# Patient Record
Sex: Female | Born: 1970 | Race: White | Hispanic: No | Marital: Married | Smoking: Never smoker
Health system: Southern US, Community
[De-identification: ages and names within clinical notes are randomized; demographics above are authoritative.]

## PROBLEM LIST (undated history)

## (undated) DIAGNOSIS — C4491 Basal cell carcinoma of skin, unspecified: Secondary | ICD-10-CM

## (undated) DIAGNOSIS — R12 Heartburn: Secondary | ICD-10-CM

## (undated) DIAGNOSIS — L409 Psoriasis, unspecified: Secondary | ICD-10-CM

## (undated) DIAGNOSIS — F419 Anxiety disorder, unspecified: Secondary | ICD-10-CM

## (undated) DIAGNOSIS — M1990 Unspecified osteoarthritis, unspecified site: Secondary | ICD-10-CM

## (undated) HISTORY — DX: Heartburn: R12

## (undated) HISTORY — DX: Psoriasis, unspecified: L40.9

## (undated) HISTORY — DX: Unspecified osteoarthritis, unspecified site: M19.90

## (undated) HISTORY — PX: APPENDECTOMY: SHX54

## (undated) HISTORY — PX: CHOLECYSTECTOMY: SHX55

## (undated) HISTORY — PX: ANTERIOR CRUCIATE LIGAMENT REPAIR: SHX115

## (undated) HISTORY — DX: Anxiety disorder, unspecified: F41.9

---

## 2008-09-25 ENCOUNTER — Ambulatory Visit: Payer: Self-pay | Admitting: Oncology

## 2008-10-06 ENCOUNTER — Ambulatory Visit: Payer: Self-pay | Admitting: Oncology

## 2008-10-26 ENCOUNTER — Ambulatory Visit: Payer: Self-pay | Admitting: Oncology

## 2008-11-25 ENCOUNTER — Ambulatory Visit: Payer: Self-pay | Admitting: Oncology

## 2009-02-23 ENCOUNTER — Ambulatory Visit: Payer: Self-pay | Admitting: Oncology

## 2009-03-02 ENCOUNTER — Ambulatory Visit: Payer: Self-pay | Admitting: Oncology

## 2009-03-26 ENCOUNTER — Ambulatory Visit: Payer: Self-pay | Admitting: Oncology

## 2009-10-26 ENCOUNTER — Ambulatory Visit: Payer: Self-pay | Admitting: Oncology

## 2009-11-18 ENCOUNTER — Ambulatory Visit: Payer: Self-pay | Admitting: Oncology

## 2009-11-25 ENCOUNTER — Ambulatory Visit: Payer: Self-pay | Admitting: Oncology

## 2009-12-26 ENCOUNTER — Ambulatory Visit: Payer: Self-pay | Admitting: Oncology

## 2010-03-16 ENCOUNTER — Ambulatory Visit: Payer: Self-pay

## 2010-05-26 ENCOUNTER — Ambulatory Visit: Payer: Self-pay | Admitting: Oncology

## 2010-06-09 ENCOUNTER — Ambulatory Visit: Payer: Self-pay | Admitting: Unknown Physician Specialty

## 2010-06-24 ENCOUNTER — Ambulatory Visit: Payer: Self-pay | Admitting: Oncology

## 2010-06-25 ENCOUNTER — Ambulatory Visit: Payer: Self-pay | Admitting: Oncology

## 2010-12-30 ENCOUNTER — Ambulatory Visit: Payer: Self-pay | Admitting: Oncology

## 2011-01-26 ENCOUNTER — Ambulatory Visit: Payer: Self-pay | Admitting: Oncology

## 2011-09-07 ENCOUNTER — Ambulatory Visit: Payer: Self-pay | Admitting: Oncology

## 2011-09-26 ENCOUNTER — Ambulatory Visit: Payer: Self-pay | Admitting: Oncology

## 2011-10-21 ENCOUNTER — Inpatient Hospital Stay: Payer: Self-pay | Admitting: Surgery

## 2011-12-27 HISTORY — PX: APPENDECTOMY: SHX54

## 2016-12-06 ENCOUNTER — Ambulatory Visit (INDEPENDENT_AMBULATORY_CARE_PROVIDER_SITE_OTHER): Payer: BC Managed Care – PPO | Admitting: Urology

## 2016-12-06 ENCOUNTER — Encounter: Payer: Self-pay | Admitting: Urology

## 2016-12-06 VITALS — BP 139/82 | HR 75 | Ht 66.0 in | Wt 174.3 lb

## 2016-12-06 DIAGNOSIS — N390 Urinary tract infection, site not specified: Secondary | ICD-10-CM

## 2016-12-06 DIAGNOSIS — R350 Frequency of micturition: Secondary | ICD-10-CM | POA: Diagnosis not present

## 2016-12-06 DIAGNOSIS — R102 Pelvic and perineal pain: Secondary | ICD-10-CM

## 2016-12-06 DIAGNOSIS — R300 Dysuria: Secondary | ICD-10-CM | POA: Diagnosis not present

## 2016-12-06 LAB — BLADDER SCAN AMB NON-IMAGING: Scan Result: 68

## 2016-12-06 NOTE — Progress Notes (Signed)
12/06/2016 4:11 PM   Brenda Miles 1971/02/18 [id number]  Referring provider: Mortimer Fries, PA-C [address] [facility] [address] [place], [place] [address]  Chief Complaint  Patient presents with  . New Patient (Initial Visit)    recurrent uti referred by Dr. Mable Paris    HPI: Patient is a 45 -year-old Caucasian female who is referred to [place] by, Mortimer Fries, PA-C, for recurrent urinary tract infections.  Patient states that she has had 6 urinary tract infections since May 2017.    Her symptoms with a urinary tract infection consist of frequency, dysuria and suprapubic pain.  These symptoms are not consistent and occur intermittently.  She states that her symptoms did not completely clear with the antibiotics.    She saw her gynecologist at the [facility] center in [place].  That physician stated that she did not have recurrent UTI's after reviewing her records.  She states that her physician expressed a possibility of IC.    She denies gross hematuria, back pain, abdominal pain or flank pain.  She has not had any recent fevers, chills, nausea or vomiting.   She does not have a history of nephrolithiasis, GU surgery or GU trauma.   Reviewing her records,  she has had one documented positive urine culture for E. Coli.     She is sexually active.  She has not noted a correlation with her urinary tract infections and sexual intercourse.    She admits to diarrhea.   She does have incontinence with stress occasional.   She is not using incontinence pads.   She is having pain with bladder filling if she does not get up during the night with a full bladder.    She had not had any recent imaging studies.    She is drinking 64 oz of water daily.  She has coffee (1 cup) in the am.  No sodas.       PMH: Past Medical History:  Diagnosis Date  . Anxiety   . Arthritis   . Heartburn     Surgical History: Past Surgical History:  Procedure Laterality Date  .  ANTERIOR CRUCIATE LIGAMENT REPAIR    . APPENDECTOMY      Home Medications:    Medication List       Accurate as of 12/06/16  4:11 PM. Always use your most recent med list.          Cranberry 400 MG Caps Take by mouth.   DULoxetine 30 MG capsule Commonly known as:  CYMBALTA Take by mouth.   ferrous sulfate 325 (65 FE) MG tablet Take by mouth.   FISH OIL PO Take by mouth.   PROBIOTIC ADVANCED PO Take by mouth.       Allergies: No Known Allergies  Family History: Family History  Problem Relation Age of Onset  . Prostate cancer Neg Hx   . Kidney cancer Neg Hx   . Bladder Cancer Neg Hx     Social History:  reports that she has never smoked. She has never used smokeless tobacco. She reports that she drinks alcohol. She reports that she does not use drugs.  ROS: UROLOGY Frequent Urination?: No Hard to postpone urination?: Yes Burning/pain with urination?: No Get up at night to urinate?: Yes Leakage of urine?: Yes Urine stream starts and stops?: No Trouble starting stream?: No Do you have to strain to urinate?: No Blood in urine?: No Urinary tract infection?: Yes Sexually transmitted disease?: No Injury to kidneys or  bladder?: No Painful intercourse?: No Weak stream?: No Currently pregnant?: No Vaginal bleeding?: No Last menstrual period?: 11/20/2016  Gastrointestinal Nausea?: No Vomiting?: No Indigestion/heartburn?: Yes Diarrhea?: No Constipation?: No  Constitutional Fever: No Night sweats?: No Weight loss?: No Fatigue?: No  Skin Skin rash/lesions?: No Itching?: No  Eyes Blurred vision?: No Double vision?: No  Ears/Nose/Throat Sore throat?: No Sinus problems?: No  Hematologic/Lymphatic Swollen glands?: No Easy bruising?: No  Cardiovascular Leg swelling?: No Chest pain?: No  Respiratory Cough?: No Shortness of breath?: No  Endocrine Excessive thirst?: No  Musculoskeletal Back pain?: No Joint pain?:  No  Neurological Headaches?: Yes Dizziness?: No  Psychologic Depression?: No Anxiety?: No  Physical Exam: BP 139/82   Pulse 75   Ht 5\' 6"  (1.676 m)   Wt 174 lb 4.8 oz (79.1 kg)   LMP 11/20/2016   BMI 28.13 kg/m   Constitutional: Well nourished. Alert and oriented, No acute distress. HEENT: [place] AT, moist mucus membranes. Trachea midline, no masses. Cardiovascular: No clubbing, cyanosis, or edema. Respiratory: Normal respiratory effort, no increased work of breathing. GI: Abdomen is soft, non tender, non distended, no abdominal masses. Liver and spleen not palpable.  No hernias appreciated.  Stool sample for occult testing is not indicated.   GU: No CVA tenderness.  No bladder fullness or masses.   Skin: No rashes, bruises or suspicious lesions. Lymph: No cervical or inguinal adenopathy. Neurologic: Grossly intact, no focal deficits, moving all 4 extremities. Psychiatric: Normal mood and affect.  Laboratory Data: Urinalysis Unremarkable.  See EPIC.   Pertinent Imaging: Results for JONIYA, TAPER (MRN [id number]) as of 12/06/2016 15:47  Ref. Range 12/06/2016 15:18  Scan Result Unknown 68    Assessment & Plan:    1. Suprapubic pain  - UA is unremarkable at this time  - offered behavioral therapies, bladder training, bladder control strategies and pelvic floor physical therapy - patient would like a referral to PT  - fluid management - encouraged her to continue her water intake  - given a dietary list of bladder irritants  - BLADDER SCAN AMB NON-IMAGING  - explained to the patient the IC is a diagnosis of exclusion with no cure and is managed by controlling symptoms and patients I have referred to PT have done extremely well  2. Frequency  - see above  3. Dysuria  - see above  Return for refer to PT, follow up after PT.  These notes generated with voice recognition software. I apologize for typographical errors.  Zara Council, [facility] [address] [place], [place] [address] [phone]

## 2016-12-07 LAB — URINALYSIS, COMPLETE
Bilirubin, UA: NEGATIVE
Glucose, UA: NEGATIVE
KETONES UA: NEGATIVE
LEUKOCYTES UA: NEGATIVE
NITRITE UA: NEGATIVE
Protein, UA: NEGATIVE
RBC, UA: NEGATIVE
Urobilinogen, Ur: 0.2 mg/dL (ref 0.2–1.0)
pH, UA: 5.5 (ref 5.0–7.5)

## 2016-12-07 LAB — MICROSCOPIC EXAMINATION
BACTERIA UA: NONE SEEN
RBC MICROSCOPIC, UA: NONE SEEN /HPF (ref 0–?)

## 2017-03-02 ENCOUNTER — Ambulatory Visit: Payer: BC Managed Care – PPO | Attending: Urology | Admitting: Physical Therapy

## 2017-03-02 ENCOUNTER — Encounter: Payer: Self-pay | Admitting: Physical Therapy

## 2017-03-02 DIAGNOSIS — R2689 Other abnormalities of gait and mobility: Secondary | ICD-10-CM

## 2017-03-02 DIAGNOSIS — R278 Other lack of coordination: Secondary | ICD-10-CM | POA: Insufficient documentation

## 2017-03-02 DIAGNOSIS — M6281 Muscle weakness (generalized): Secondary | ICD-10-CM | POA: Insufficient documentation

## 2017-03-02 NOTE — Patient Instructions (Addendum)
You are now ready to begin training the deep core muscles system: diaphragm, transverse abdominis, pelvic floor . These muscles must work together as a team.        The key to these exercises to train the brain to coordinate the timing of these muscles and to have them turn on for long periods of time to hold you upright against gravity (especially important if you are on your feet all day).These muscles are postural muscles and play a role stabilizing your spine and bodyweight. By doing these repetitions slowly and correctly instead of doing crunches, you will achieve a flatter belly without a lower pooch. You are also placing your spine in a more neutral position and breathing properly which in turn, decreases your risk for problems related to your pelvic floor, abdominal, and low back such as pelvic organ prolapse, hernias, diastasis recti (separation of superficial muscles), disk herniations, spinal fractures. These exercises set a solid foundation for you to later progress to resistance/ strength training with therabands and weights and return to other typical fitness exercises with a stronger deeper core.                                                  Figure four stretch on R 5 breaths seated / laying down

## 2017-03-03 NOTE — Therapy (Signed)
[place] MAIN [facility] SERVICES [address] [place], [place], [address] Phone: [phone]   Fax:  [phone]  Physical Therapy Evaluation  Patient Details  Name: Brenda Miles MRN: [id number] Date of Birth: [birth year] Referring Provider: Ernestine Conrad  Encounter Date: 03/02/2017      PT End of Session - 03/03/17 1905    Visit Number 1   Number of Visits 12   Date for PT Re-Evaluation 05/19/17   PT Start Time 7001   PT Stop Time 1820   PT Time Calculation (min) 70 min      Past Medical History:  Diagnosis Date  . Anxiety   . Arthritis    hands  . Heartburn   . Psoriasis    feet     Past Surgical History:  Procedure Laterality Date  . ANTERIOR CRUCIATE LIGAMENT REPAIR Left   . APPENDECTOMY      There were no vitals filed for this visit.       Subjective Assessment - 03/03/17 1902    Subjective 1) Pubic pain: In May-Sept 2017, pt was Dx with UTI 5x and she took antibiotics 2x over this period. Pt consulted a gynceologist who specializes in UTI and she was informed thast she was not positive for UTIs.  She referred ot to a urologist with a working hypothesis for IC Dx. 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  Pt's Sx include pressure sensation, achey (3/10 pain) , urge to urinate with small amounts, frequent urination. Pt would experience these epsiodes atleast once a month. Recent episodes have decreased in pain by 50% compared to the ones that occurred 6 months ago.  Pt started decreasing her intake of spicy foods and tomato foods. Pt notices alcohol triggers her Sx. Fluid intake: 64 fl oz water, 2 cups of coffee, no sodas. occasionally green tea/ alcohol.  straining with bowel movements but they occur once a day. Bristol Stool Scale 3-5.  Ocassional pain with intercourse. Low back pain increases with menstrual cycle.  Urinary frequency increases during menstrual cycle.  2) SUI with coughing, sneezing, laughing, occasionally with exercising  .     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  3 vaginal deliveries with episiotomy with her 3rd delivery. Hx of appendectomy. Anxiety with overwhelming feeling of not being able to get things down. This is managed with organized calender. Pt would like to get back to fitness with yoga and pilates tape (21-day fit)              [facility] PT Assessment - 03/03/17 1902      Assessment   Medical Diagnosis suprapubic pain   Referring Provider McGowan     Precautions   Precautions None     Restrictions   Weight Bearing Restrictions No     Balance Screen   Has the patient fallen in the past 6 months No     Prior Function   Level of Independence Independent     Observation/Other Assessments   Other Surveys  --  NIH -CPSI 40%      Coordination   Gross Motor Movements are Fluid and Coordinated --  chest breathing, limited pelvic floor lengthening     AROM   Overall AROM Comments L rotation ~25%, R ~45 %      Strength   Overall Strength Comments hip abd 3/5 B      Palpation   Spinal mobility increased thoracic hypomobility, mm tensions  B   SI assessment  to assess for obliquities at next session   Palpation comment R ischiocavernosus tightness and tenderness on R > L, obturator internus R tightness and tenderness   tenderness over bladder area with light palpation                  Pelvic Floor Special Questions - 03/02/17 1810    Diastasis Recti neg           [facility] Adult PT Treatment/Exercise - 03/03/17 1902      Bed Mobility   Bed Mobility --  crunch method OOB      Posture/Postural Control   Posture/Postural Control --  lumbopelvic perturbations with leg movements in supine     Therapeutic Activites    Therapeutic Activities --  see pt instructions                PT Education - 03/03/17 1905    Education provided Yes   Education Details POC, anatomy/physiology, goals, HEP   Person(s) Educated Patient    Methods Explanation;Demonstration;Tactile cues;Handout;Verbal cues   Comprehension Returned demonstration;Verbalized understanding             PT Long Term Goals - 03/02/17 1731      PT LONG TERM GOAL #1   Title Pt will report less episodes of the pressure sensation and frequent urination from 1-2 x month to < 1 x month in order to improve QOL    Time 12   Period Weeks   Status New     PT LONG TERM GOAL #2   Title Pt will improve stool consistency from Type 3-5 to average Type 4 across 75% of the time for 1 week in order to not strain pelvic floor   Time 12   Period Weeks   Status New     PT LONG TERM GOAL #3   Title Pt will demo proper alignment and coordination techniques with yoga and pilates postures in order to minimize relapse of Sx    Time 12   Period Weeks   Status New     PT LONG TERM GOAL #4   Title Pt will void every 2 hrs instead of holding her bladder to urinate only 1-2 x day across 8 hr day at work in order to better her Sx and normalize pelvic floor function   Time 12   Period Weeks   Status New     PT LONG TERM GOAL #5   Title Pt will report no tenderness over bladder area and R ischiocavernosus /obt internus  with palpation across 2 visits in order to decrease pain   Time 12   Period Weeks   Status New     Additional Long Term Goals   Additional Long Term Goals Yes     PT LONG TERM GOAL #6   Title Pt will demo decreased thoracic tensions/ kyphosis and no lumbopelvic perturbations with leg movements in supine in order to demo increased intraabdominal pressure for proper pelvic floor function    Time 12   Period Weeks   Status New     PT LONG TERM GOAL #7   Title Pt will decrease her NIH -CPSI  score from 40%  to < 30% in order to improve QOL   Time 12   Period Weeks   Status New               Plan - 03/03/17 1905    Clinical Impression Statement  Pt is a 46 yo female who complaints of Sx that are related to her Dx of IC ( urinary  urgency, frequency, pubic pain, varying stool consistency)  in addition to SUI.  Pt's presentation include  chest breathing with limited pelvic floor coordination, thoracic hypomobility/mm tensions, pain with palpation over suprapubic area,  increased lumbar lordosis, weak hip strength, poor form/technique with fitness routine, and  poor body mechanics mechanics. Pt was educated today on proper pelvic floor coordination with diaphramatic breathing and body mechanics to minimize load onto her pelvic floor mm.      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 sit ups,  3 vaginal deliveries w/ episiotomies   PT Frequency 1x / week   PT Duration 12 weeks   PT Treatment/Interventions ADLs/Self Care Home Management;Aquatic Therapy;Moist Heat;Traction;Neuromuscular re-education;Balance training;Stair training;Therapeutic exercise;Therapeutic activities;Patient/family education;Manual techniques;Scar mobilization;Manual lymph drainage;Passive range of motion;Taping;Functional mobility training;Energy conservation   Consulted and Agree with Plan of Care Patient      Patient will benefit from skilled therapeutic intervention in order to improve the following deficits and impairments:  Pain, Decreased coordination, Decreased safety awareness, Hypermobility, Postural dysfunction, Decreased range of motion, Decreased endurance, Decreased mobility, Increased muscle spasms, Improper body mechanics  Visit Diagnosis: Other lack of coordination  Other abnormalities of gait and mobility  Muscle weakness (generalized)     Problem List There are no active problems to display for this patient.   Jerl Mina ,PT, DPT, E-RYT  03/03/2017, 7:15 PM  [place] MAIN [facility] SERVICES [address] [place], [place], [address] Phone: [phone]   Fax:  [phone]  Name: Brenda Miles MRN:  [id number] Date of Birth: [birth year]

## 2017-03-09 ENCOUNTER — Ambulatory Visit: Payer: BC Managed Care – PPO | Admitting: Physical Therapy

## 2017-03-09 DIAGNOSIS — M6281 Muscle weakness (generalized): Secondary | ICD-10-CM

## 2017-03-09 DIAGNOSIS — R278 Other lack of coordination: Secondary | ICD-10-CM

## 2017-03-09 DIAGNOSIS — R2689 Other abnormalities of gait and mobility: Secondary | ICD-10-CM

## 2017-03-09 NOTE — Patient Instructions (Signed)
Stretches to decrease midback spine:  Handout on open book   ____________  Strengthening for hips , gluts   Clam Shell 45 Degrees   Lying with hips and knees bent 45, one pillow between knees and ankles. Lift knee with exhale. Be sure pelvis does not roll backward. Do not arch back. Do 10 times, each leg, 2 times per day.  http://ss.exer.us/75   Copyright  VHI. All rights reserved.    __________  Dennis Bast are now ready to begin training the deep core muscles system: diaphragm, transverse abdominis, pelvic floor . These muscles must work together as a team.           The key to these exercises to train the brain to coordinate the timing of these muscles and to have them turn on for long periods of time to hold you upright against gravity (especially important if you are on your feet all day).These muscles are postural muscles and play a role stabilizing your spine and bodyweight. By doing these repetitions slowly and correctly instead of doing crunches, you will achieve a flatter belly without a lower pooch. You are also placing your spine in a more neutral position and breathing properly which in turn, decreases your risk for problems related to your pelvic floor, abdominal, and low back such as pelvic organ prolapse, hernias, diastasis recti (separation of superficial muscles), disk herniations, spinal fractures. These exercises set a solid foundation for you to later progress to resistance/ strength training with therabands and weights and return to other typical fitness exercises with a stronger deeper core.   Do level 1 : 10 reps Do level 2: 10 reps (left and right = 1 rep) x 3 sets , 2 x day Do not progress to level 3 for 3-4 weeks. You know you are ready when you do not have any rocking of pelvis nor arching in your back   (handout)

## 2017-03-09 NOTE — Therapy (Signed)
[place] MAIN [facility] SERVICES [address] [place], [place], [address] Phone: [phone]   Fax:  [phone]  Physical Therapy Treatment  Patient Details  Name: Anacarolina Evelyn MRN: [id number] Date of Birth: 1971/10/09 Referring Provider: Ernestine Conrad  Encounter Date: 03/09/2017      PT End of Session - 03/09/17 1804    Visit Number 2   Number of Visits 12   Date for PT Re-Evaluation 05/19/17   PT Start Time 6962   PT Stop Time 1805   PT Time Calculation (min) 60 min   Activity Tolerance Patient tolerated treatment well;No increased pain   Behavior During Therapy WFL for tasks assessed/performed      Past Medical History:  Diagnosis Date  . Anxiety   . Arthritis    hands  . Heartburn   . Psoriasis    feet     Past Surgical History:  Procedure Laterality Date  . ANTERIOR CRUCIATE LIGAMENT REPAIR Left   . APPENDECTOMY      There were no vitals filed for this visit.      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  3 vaginal deliveries with episiotomy with her 3rd delivery. Hx of appendectomy. Anxiety with overwhelming feeling of not being able to get things down. This is managed with organized calender. Pt would like to get back to fitness with yoga and pilates tape (21-day fit)              [facility] PT Assessment - 03/09/17 1751      Palpation   Spinal mobility decreased thoracic mm tensions B, and increased hypomobility at T7, 10 (improved post Tx)    SI assessment  L ASIS more anterior, limited sacral mobility over iliac crest (improved post Tx)    Palpation comment R ischiocavernosus/ urethral compressae with tightness/ tenderness >L, deep transverse perineal mm on R with tightness and tenderness (decreased with  post Tx)                       [facility] Adult PT Treatment/Exercise - 03/09/17 1757      Therapeutic Activites    Therapeutic Activities --  see pt instructions      Neuro Re-ed    Neuro Re-ed Details  see pt instruction     Modalities   Modalities Moist Heat     Moist Heat Therapy   Number Minutes Moist Heat 8 Minutes   Moist Heat Location --  lumbar/thoracic     Manual Therapy   Manual therapy comments external Tx (STM. MWM, MET) on problem areas of pelvic floor listed in assessment    Joint Mobilization long axis distaction on LLE, inferior/superior mob of sacum, PA mob along sacral lateral edge with rotation mob in hip flexion and ext and hip ER/ abd on L                 PT Education - 03/09/17 1804    Education provided Yes   Education Details HEP   Person(s) Educated Patient   Methods Explanation;Demonstration;Tactile cues;Verbal cues;Handout   Comprehension Returned demonstration;Verbalized understanding;Verbal cues required;Tactile cues required             PT Long Term Goals - 03/02/17 1731  PT LONG TERM GOAL #1   Title Pt will report less episodes of the pressure sensation and frequent urination from 1-2 x month to < 1 x month in order to improve QOL    Time 12   Period Weeks   Status New     PT LONG TERM GOAL #2   Title Pt will improve stool consistency from Type 3-5 to average Type 4 across 75% of the time for 1 week in order to not strain pelvic floor   Time 12   Period Weeks   Status New     PT LONG TERM GOAL #3   Title Pt will demo proper alignment and coordination techniques with yoga and pilates postures in order to minimize relapse of Sx    Time 12   Period Weeks   Status New     PT LONG TERM GOAL #4   Title Pt will void every 2 hrs instead of holding her bladder to urinate only 1-2 x day across 8 hr day at work in order to better her Sx and normalize pelvic floor function   Time 12   Period Weeks   Status New      PT LONG TERM GOAL #5   Title Pt will report no tenderness over bladder area and R ischiocavernosus /obt internus  with palpation across 2 visits in order to decrease pain   Time 12   Period Weeks   Status New     Additional Long Term Goals   Additional Long Term Goals Yes     PT LONG TERM GOAL #6   Title Pt will demo decreased thoracic tensions/ kyphosis and no lumbopelvic perturbations with leg movements in supine in order to demo increased intraabdominal pressure for proper pelvic floor function    Time 12   Period Weeks   Status New     PT LONG TERM GOAL #7   Title Pt will decrease her NIH -CPSI  score from 40%  to < 30% in order to improve QOL   Time 12   Period Weeks   Status New               Plan - 03/09/17 2313    Clinical Impression Statement Manual Tx today corrected pelvic obliqutities, decreased thoracic mm tensions/hypombility, and decreased pelvic floor mm (external Tx)  on urogenital triangle R >L. Pt demo'd improved deep core coordination and increased diaphragnatic/ pelvic floor excursion post Tx. Pt had no complaints. Pt progressed to deep core level 2 and glut strengthening. Pt will continue to benefit from skilled PT.   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 sit ups   PT Frequency 1x / week   PT Duration 12 weeks   PT Treatment/Interventions ADLs/Self Care Home Management;Aquatic Therapy;Moist Heat;Traction;Neuromuscular re-education;Balance training;Stair training;Therapeutic exercise;Therapeutic activities;Patient/family education;Manual techniques;Scar mobilization;Manual lymph drainage;Passive range of motion;Taping;Functional mobility training;Energy conservation   Consulted and Agree with Plan of Care Patient      Patient will benefit from skilled therapeutic intervention in order to improve the following deficits and impairments:  Pain, Decreased coordination, Decreased  safety awareness, Hypermobility, Postural dysfunction, Decreased range of motion, Decreased endurance, Decreased mobility, Increased muscle spasms, Improper body mechanics  Visit Diagnosis: Other lack of coordination  Other abnormalities of gait and mobility  Muscle weakness (generalized)     Problem List There are no active problems to display for this patient.   Brenda Miles ,PT, DPT, E-RYT  03/09/2017, 11:16 PM  [place] MAIN [facility] SERVICES [address] [place], [place], [address] Phone: [phone]   Fax:  [phone]  Name: Zyionna Pesce MRN: [id number] Date of Birth: [birth year]

## 2017-03-16 ENCOUNTER — Ambulatory Visit: Payer: BC Managed Care – PPO | Admitting: Physical Therapy

## 2017-03-16 DIAGNOSIS — R278 Other lack of coordination: Secondary | ICD-10-CM

## 2017-03-16 DIAGNOSIS — M6281 Muscle weakness (generalized): Secondary | ICD-10-CM

## 2017-03-16 DIAGNOSIS — R2689 Other abnormalities of gait and mobility: Secondary | ICD-10-CM

## 2017-03-16 NOTE — Patient Instructions (Signed)
Toning to allow your exhalation to bring ribcage down towards pelvis  (inhale: inflate into the back, front, left/right so the ribcage expands like tree ring)   Rest your hands at the ribcage to sense the movement downward as you make these sounds long and smooth  "ng" as in "sing"  Visualize violet at the crown of the head  "eeeeee"  Visualize indigo between eye brows   "mmmmm"  Visualize blue at the throat  "A" as "Play"  Visualize green at the heart    "ah" as in "laaaa"     Visualize yellow at abdomen  "Oooo" as "Do" Visualize orange at the sexual organs  "O " as "Tote"  Visualize red at the [employer] with body scan   This exercise will help with moving the diaphragm at its full range for peristalsis  ______   Sitting with feet on ground  ______  Pelvic floor stretches:  1.   Frog stretch: laying on belly with pillow under hips, knees bent, inhale do nothing, exhale let ankles fall apart ~4 10 reps x 3     2. Ardine Eng pose  3 breaths    3. 3-way child pose  3 breaths each L/r  4. Child pose rocking

## 2017-03-17 NOTE — Therapy (Signed)
[place] MAIN [facility] SERVICES [address] [place], [place], [address] Phone: [phone]   Fax:  [phone]  Physical Therapy Treatment  Patient Details  Name: Brenda Miles MRN: [id number] Date of Birth: 06/14/1971 Referring Provider: Ernestine Conrad  Encounter Date: 03/16/2017      PT End of Session - 03/17/17 1817    Visit Number 3   Number of Visits 12   Date for PT Re-Evaluation 05/19/17   PT Start Time 1941   PT Stop Time 1800   PT Time Calculation (min) 55 min   Activity Tolerance Patient tolerated treatment well;No increased pain   Behavior During Therapy WFL for tasks assessed/performed      Past Medical History:  Diagnosis Date  . Anxiety   . Arthritis    hands  . Heartburn   . Psoriasis    feet     Past Surgical History:  Procedure Laterality Date  . ANTERIOR CRUCIATE LIGAMENT REPAIR Left   . APPENDECTOMY      There were no vitals filed for this visit.      Subjective Assessment - 03/16/17 1707    Subjective Pt reported her urination frequency has decreased. Pt reported increased pain in the suprapubic area since last session, increasing more in seated position. Pt's preferred seated position is with thighs crossed.    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  3 vaginal deliveries with episiotomy with her 3rd delivery. Hx of appendectomy. Anxiety with overwhelming feeling of not being able to get things down. This is managed with organized calender. Pt would like to get back to fitness with yoga and pilates tape (21-day fit)              [facility] PT Assessment - 03/17/17 1816      Observation/Other Assessments   Observations cued to uncross thighs in seated posture     Coordination   Gross Motor Movements are Fluid and Coordinated --  pelvic tilt to decrease pelvic floor mm tensions     Palpation   SI assessment  ASIS more symmetrical    Palpation comment decreased  tensions at R ischiocavernosus                   Pelvic Floor Special Questions - 03/17/17 1814    Pelvic Floor Internal Exam pt consented verbally without contraindications    Exam Type Vaginal   Palpation increased tensions. tenderness at R > L bulbospongiosus            [facility] Adult PT Treatment/Exercise - 03/17/17 1815      Therapeutic Activites    Therapeutic Activities --  see pt instructions      Neuro Re-ed    Neuro Re-ed Details  see pt instruction     Manual Therapy   Internal Pelvic Floor MET, STM, thiele massage on B bulbospongiosus                 PT Education - 03/17/17 1817    Education provided Yes   Education Details HEP   Person(s) Educated Patient   Methods Explanation;Demonstration;Tactile cues;Verbal cues;Handout   Comprehension Returned demonstration;Verbalized understanding             PT Long Term Goals - 03/16/17 1714      PT LONG TERM GOAL #1   Title Pt will report less episodes of the pressure sensation and frequent urination from 1-2 x month to < 1  x month in order to improve QOL    Time 12   Period Weeks   Status On-going     PT LONG TERM GOAL #2   Title Pt will improve stool consistency from Type 3-5 to average Type 4 across 75% of the time for 1 week in order to not strain pelvic floor   Time 12   Period Weeks   Status On-going     PT LONG TERM GOAL #3   Title Pt will demo proper alignment and coordination techniques with yoga and pilates postures in order to minimize relapse of Sx    Time 12   Period Weeks   Status New     PT LONG TERM GOAL #4   Title Pt will void every 2 hrs instead of holding her bladder to urinate only 1-2 x day across 8 hr day at work in order to better her Sx and normalize pelvic floor function   Time 12   Period Weeks   Status New     PT LONG TERM GOAL #5   Title Pt will report no tenderness over bladder area and R ischiocavernosus /obt internus  with palpation across 2 visits  in order to decrease pain   Time 12   Period Weeks   Status On-going     PT LONG TERM GOAL #6   Title Pt will demo decreased thoracic tensions/ kyphosis and no lumbopelvic perturbations with leg movements in supine in order to demo increased intraabdominal pressure for proper pelvic floor function    Time 12   Period Weeks   Status Achieved     PT LONG TERM GOAL #7   Title Pt will decrease her NIH -CPSI  score from 40%  to < 30% in order to improve QOL   Time 12   Period Weeks   Status On-going               Plan - 03/17/17 1818    Clinical Impression Statement Pt is progressing well with report of decreased urination frequency after last session.Addressed pt's c/o suprapubic pain with manual Tx and seating mechanics training. Pt demo'd decreased 1-2nd layers of pelvic floor mm from last session and today's session. Pt tolerated manual Tx without complaints. Pt reported decreased tenderness post Tx and demo'd improved pelvic floor lengthening. Pt reported cuing to slow her breathing coordination in deep core level 2 to minmize dizziness. Pt reported less dizziness with proper technique with deep core level 2. Pt continue to benefit from skilled PT.      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 sit ups   PT Frequency 1x / week   PT Duration 12 weeks   PT Treatment/Interventions ADLs/Self Care Home Management;Aquatic Therapy;Moist Heat;Traction;Neuromuscular re-education;Balance training;Stair training;Therapeutic exercise;Therapeutic activities;Patient/family education;Manual techniques;Scar mobilization;Manual lymph drainage;Passive range of motion;Taping;Functional mobility training;Energy conservation   Consulted and Agree with Plan of Care Patient      Patient will benefit from skilled therapeutic intervention in order to improve the following deficits and impairments:  Pain, Decreased coordination,  Decreased safety awareness, Hypermobility, Postural dysfunction, Decreased range of motion, Decreased endurance, Decreased mobility, Increased muscle spasms, Improper body mechanics  Visit Diagnosis: Other lack of coordination  Other abnormalities of gait and mobility  Muscle weakness (generalized)     Problem List There are no active problems to display for this patient.   Jerl Mina ,PT, DPT, E-RYT  03/17/2017, 6:26 PM  [place]  [place] MAIN [facility] SERVICES [address] [place], [place], [address] Phone: [phone]   Fax:  [phone]  Name: Brenda Miles MRN: [id number] Date of Birth: [birth year]

## 2017-03-23 ENCOUNTER — Ambulatory Visit: Payer: BC Managed Care – PPO | Admitting: Physical Therapy

## 2017-03-23 DIAGNOSIS — R2689 Other abnormalities of gait and mobility: Secondary | ICD-10-CM

## 2017-03-23 DIAGNOSIS — R278 Other lack of coordination: Secondary | ICD-10-CM | POA: Diagnosis not present

## 2017-03-23 DIAGNOSIS — M6281 Muscle weakness (generalized): Secondary | ICD-10-CM

## 2017-03-23 NOTE — Therapy (Signed)
[place] MAIN [facility] SERVICES [address] [place], [place], [address] Phone: [phone]   Fax:  [phone]  Physical Therapy Treatment  Patient Details  Name: Delana Manganello MRN: [id number] Date of Birth: [birth year] Referring Provider: Ernestine Conrad  Encounter Date: 03/23/2017      PT End of Session - 03/23/17 1818    Visit Number 4   Number of Visits 12   Date for PT Re-Evaluation 05/19/17   PT Start Time 5188   PT Stop Time 1820   PT Time Calculation (min) 50 min   Activity Tolerance Patient tolerated treatment well;No increased pain   Behavior During Therapy WFL for tasks assessed/performed      Past Medical History:  Diagnosis Date  . Anxiety   . Arthritis    hands  . Heartburn   . Psoriasis    feet     Past Surgical History:  Procedure Laterality Date  . ANTERIOR CRUCIATE LIGAMENT REPAIR Left   . APPENDECTOMY      There were no vitals filed for this visit.      Subjective Assessment - 03/23/17 1804    Subjective Pt feels the suprapubic mm spasms have increased and is expanding across more to the sides. The pain lessens with leaning back. It comes on with sitting.    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  3 vaginal deliveries with episiotomy with her 3rd delivery. Hx of appendectomy. Anxiety with overwhelming feeling of not being able to get things down. This is managed with organized calender. Pt would like to get back to fitness with yoga and pilates tape (21-day fit)              [facility] PT Assessment - 03/23/17 1809      AROM   Overall AROM Comments full hip flexion in sidelying B with reproduction of pain (decreased post Tx)      Palpation   Palpation comment increased tensions and tenderness with palpation over pubic symphysis and tubercle L > R ( decreased post Tx)                      [facility] Adult PT Treatment/Exercise - 03/23/17 1810      Therapeutic  Activites    Therapeutic Activities --  see pt instructions      Manual Therapy   Internal Pelvic Floor long axis distraction in to hip ext with belt B . STM/ MWM over pubic tubercle B                      PT Long Term Goals - 03/23/17 1825      PT LONG TERM GOAL #1   Title Pt will report less episodes of the pressure sensation and frequent urination from 1-2 x month to < 1 x month in order to improve QOL    Time 12   Period Weeks   Status Partially Met     PT LONG TERM GOAL #2   Title Pt will improve stool consistency from Type 3-5 to average Type 4 across 75% of the time for 1 week in order to not strain pelvic floor   Time 12   Period Weeks   Status On-going     PT LONG TERM GOAL #3   Title Pt will demo proper alignment and coordination techniques with yoga and pilates postures in order to minimize relapse of Sx  Time 12   Period Weeks   Status On-going     PT LONG TERM GOAL #4   Title Pt will void every 2 hrs instead of holding her bladder to urinate only 1-2 x day across 8 hr day at work in order to better her Sx and normalize pelvic floor function   Time 12   Period Weeks   Status On-going     PT LONG TERM GOAL #5   Title Pt will report no tenderness over bladder area and R ischiocavernosus /obt internus  with palpation across 2 visits in order to decrease pain   Time 12   Period Weeks   Status On-going     Additional Long Term Goals   Additional Long Term Goals Yes     PT LONG TERM GOAL #6   Title Pt will demo decreased thoracic tensions/ kyphosis and no lumbopelvic perturbations with leg movements in supine in order to demo increased intraabdominal pressure for proper pelvic floor function    Time 12   Period Weeks   Status Achieved     PT LONG TERM GOAL #7   Title Pt will decrease her NIH -CPSI  score from 40%  to < 30% in order to improve QOL   Time 12   Period Weeks   Status On-going     PT LONG TERM GOAL #8   Title Pt will report no  suprapubic pain with sitting across 2 weeks in order to demo self management of pain with HEP and to be able to sit in church and at work   Time 12   Period Weeks   Status New               Plan - 03/23/17 1823    Clinical Impression Statement Pt's suprapubic pain that occurs with sitting resolved following Tx today. Pt demo'd no pain with full hip flexion as well.  Added new HEP to decrease tensions over the pubic symphysis/ tubercle B.  Pt remains compliant with not crossing her legs which PT anticipates will help address her Sx. Pt continues to benefit from skilled PT.    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 sit ups   PT Frequency 1x / week   PT Duration 12 weeks   PT Treatment/Interventions ADLs/Self Care Home Management;Aquatic Therapy;Moist Heat;Traction;Neuromuscular re-education;Balance training;Stair training;Therapeutic exercise;Therapeutic activities;Patient/family education;Manual techniques;Scar mobilization;Manual lymph drainage;Passive range of motion;Taping;Functional mobility training;Energy conservation   Consulted and Agree with Plan of Care Patient      Patient will benefit from skilled therapeutic intervention in order to improve the following deficits and impairments:  Pain, Decreased coordination, Decreased safety awareness, Hypermobility, Postural dysfunction, Decreased range of motion, Decreased endurance, Decreased mobility, Increased muscle spasms, Improper body mechanics  Visit Diagnosis: Other lack of coordination  Other abnormalities of gait and mobility  Muscle weakness (generalized)     Problem List There are no active problems to display for this patient.   Brenda Miles ,PT, DPT, E-RYT  03/23/2017, 6:27 PM  [place] MAIN [facility] SERVICES [address] [place], [place], [address] Phone: [phone]   Fax:   [phone]  Name: Sharnika Binney MRN: [id number] Date of Birth: 29-Sep-1971

## 2017-03-23 NOTE — Patient Instructions (Addendum)
TO decrease pubic area muscle spasms  _recorrect with less crossed legs  _stretches:  1 Quad stretch (sidelying) pulling ankle back without arching back 5 breaths boths sides  2 Create lubrication in hip socket:  strap, R knee bent, L ballmound against strap and spread toes, rolling foot/leg 15 deg out and in across midline Anchor elbows, back of hips, opp foot down  10 reps both sides   3 Restorative pose:   Wide knees (all fours position with hips more forward than knees , belly supported over hard pillows)  10 min    ___________  Progress clam shells by propping up on elbows (placed slightly ahead of shoulder) , lifted side body no slouching

## 2017-03-27 ENCOUNTER — Encounter: Payer: BC Managed Care – PPO | Admitting: Physical Therapy

## 2017-04-06 ENCOUNTER — Encounter: Payer: Self-pay | Admitting: Physical Therapy

## 2017-04-10 ENCOUNTER — Ambulatory Visit: Payer: BC Managed Care – PPO | Attending: Urology | Admitting: Physical Therapy

## 2017-04-10 DIAGNOSIS — R278 Other lack of coordination: Secondary | ICD-10-CM | POA: Diagnosis not present

## 2017-04-10 DIAGNOSIS — R2689 Other abnormalities of gait and mobility: Secondary | ICD-10-CM | POA: Diagnosis present

## 2017-04-10 DIAGNOSIS — M6281 Muscle weakness (generalized): Secondary | ICD-10-CM | POA: Diagnosis present

## 2017-04-10 NOTE — Therapy (Signed)
[place] MAIN [facility] SERVICES [address] [place], [place], [address] Phone: [phone]   Fax:  [phone]  Physical Therapy Treatment/ Progress Note   Patient Details  Name: Brenda Miles MRN: [id number] Date of Birth: 20-Jul-1971 Referring Provider: Ernestine Conrad  Encounter Date: 04/10/2017      PT End of Session - 04/10/17 1820    Visit Number 5   Number of Visits 12   Date for PT Re-Evaluation 05/19/17   PT Start Time 6073   PT Stop Time 1800   PT Time Calculation (min) 53 min   Activity Tolerance Patient tolerated treatment well;No increased pain   Behavior During Therapy WFL for tasks assessed/performed      Past Medical History:  Diagnosis Date  . Anxiety   . Arthritis    hands  . Heartburn   . Psoriasis    feet     Past Surgical History:  Procedure Laterality Date  . ANTERIOR CRUCIATE LIGAMENT REPAIR Left   . APPENDECTOMY      There were no vitals filed for this visit.      Subjective Assessment - 04/10/17 1711    Subjective Pt reported no pubic pain following last session and was able to sit and drive on a 12 hour trip one way and back without issues.    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  3 vaginal deliveries with episiotomy with her 3rd delivery. Hx of appendectomy. Anxiety with overwhelming feeling of not being able to get things down. This is managed with organized calender. Pt would like to get back to fitness with yoga and pilates tape (21-day fit)              [facility] PT Assessment - 04/10/17 1810      Observation/Other Assessments   Observations cued to uncross thighs in seated posture     Posture/Postural Control   Posture Comments oblique oversue with deep core level 3   cued for hand under low back for postural stability      Strength   Overall Strength Comments hip 4/5 B                   Pelvic Floor Special Questions - 04/10/17 1810    Pelvic Floor Internal Exam pt consented verbally without contraindications    Exam Type Vaginal   Palpation no pelvic floor mm tensions, minor tenderness at L iliococcygeus which pt was able to minimize with cue for pelvic tilts. Noted lowered position of urethra (more cranial position post Tx).   cued for less chest breathing, abdominal accessory use    Strength weak squeeze, no lift  posterior activation > activation                         PT Long Term Goals - 04/10/17 1711      PT LONG TERM GOAL #1   Title Pt will report less episodes of the pressure sensation and frequent urination from 1-2 x month to < 1 x month in order to improve QOL    Time 12   Period Weeks   Status Achieved     PT LONG TERM GOAL #2   Title Pt will improve stool consistency from Type 3-5 to average Type 4 across 75% of the time for 1 week in order to not strain pelvic floor   Time 12   Period Weeks  Status On-going     PT LONG TERM GOAL #3   Title Pt will demo proper alignment and coordination techniques with yoga and pilates postures in order to minimize relapse of Sx    Time 12   Period Weeks   Status On-going     PT LONG TERM GOAL #4   Title Pt will void every 2 hrs instead of holding her bladder to urinate only 1-2 x day across 8 hr day at work in order to better her Sx and normalize pelvic floor function   Time 12   Period Weeks   Status Achieved     PT LONG TERM GOAL #5   Title Pt will report no tenderness over bladder area and R ischiocavernosus /obt internus  with palpation across 2 visits in order to decrease pain   Time 12   Period Weeks   Status Achieved     PT LONG TERM GOAL #6   Title Pt will demo decreased thoracic tensions/ kyphosis and no lumbopelvic perturbations with leg movements in supine in order to demo increased intraabdominal pressure for proper pelvic floor function    Time 12   Period Weeks   Status Achieved     PT LONG TERM GOAL #7   Title Pt will  decrease her NIH -CPSI  score from 40%  to < 30% in order to improve QOL. (4/16: 16% )    Time 12   Period Weeks   Status Achieved     PT LONG TERM GOAL #8   Title Pt will report no suprapubic pain with sitting across 2 weeks in order to demo self management of pain with HEP and to be able to sit in church and at work   Time 12   Period Weeks   Status Achieved               Plan - 04/10/17 1816    Clinical Impression Statement Pt has achieved 6/8 goals and is progressing towards the return-to-fitness phase. Pt reports no pubic pain across the past 3 weeks and was able to sit and drive for 12 hours each way on a family trip out of town. Pt's urinary frequency has improved and no longer delays urination. Pt's NIH-CPSI score decreased more 40% to 16% which indicate signficantly improved pelvic floor function. Pt has showed significantly improved pelvic alignment, decreased pelvic floor mm/ back mm tensions. Pt is working in improving pelvic floor ROM and coordination to elicit a more circumferential contraction.  Pt is also progressing with her deep core exercises to prepare for integration into yoga and pilates. Pt continues to benefit from skilled PT.     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 sit ups   PT Frequency 1x / week   PT Duration 12 weeks   PT Treatment/Interventions ADLs/Self Care Home Management;Aquatic Therapy;Moist Heat;Traction;Neuromuscular re-education;Balance training;Stair training;Therapeutic exercise;Therapeutic activities;Patient/family education;Manual techniques;Scar mobilization;Manual lymph drainage;Passive range of motion;Taping;Functional mobility training;Energy conservation   Consulted and Agree with Plan of Care Patient      Patient will benefit from skilled therapeutic intervention in order to improve the following deficits and impairments:  Pain, Decreased coordination, Decreased  safety awareness, Hypermobility, Postural dysfunction, Decreased range of motion, Decreased endurance, Decreased mobility, Increased muscle spasms, Improper body mechanics  Visit Diagnosis: Other lack of coordination  Other abnormalities of gait and mobility  Muscle weakness (generalized)     Problem List There are no active  problems to display for this patient.   Jerl Mina ,PT, DPT, E-RYT  04/10/2017, 6:21 PM  [place] MAIN [facility] SERVICES [address] [place], [place], [address] Phone: [phone]   Fax:  [phone]  Name: Brenda Miles MRN: [id number] Date of Birth: 08/17/1971

## 2017-04-10 NOTE — Patient Instructions (Signed)
Deep  core level 3   Focus on sequential pelvic floor lengthening and lift with breathing level 1   Clams with active side trunk and forearm pressing strongly

## 2017-04-20 ENCOUNTER — Encounter: Payer: Self-pay | Admitting: Physical Therapy

## 2017-05-04 ENCOUNTER — Ambulatory Visit: Payer: BC Managed Care – PPO | Admitting: Physical Therapy

## 2017-05-10 ENCOUNTER — Ambulatory Visit: Payer: BC Managed Care – PPO | Attending: Urology | Admitting: Physical Therapy

## 2017-05-10 DIAGNOSIS — R2689 Other abnormalities of gait and mobility: Secondary | ICD-10-CM | POA: Insufficient documentation

## 2017-05-10 DIAGNOSIS — M6281 Muscle weakness (generalized): Secondary | ICD-10-CM | POA: Insufficient documentation

## 2017-05-10 DIAGNOSIS — R278 Other lack of coordination: Secondary | ICD-10-CM | POA: Diagnosis not present

## 2017-05-10 NOTE — Therapy (Signed)
[place] MAIN [facility] SERVICES [address] [place], [place], [address] Phone: [phone]   Fax:  [phone]  Physical Therapy Treatment  Patient Details  Name: Brenda Miles MRN: [id number] Date of Birth: Nov 22, 1971 Referring Provider: Ernestine Conrad  Encounter Date: 05/10/2017      PT End of Session - 05/10/17 1643    Visit Number 6   Number of Visits 12   Date for PT Re-Evaluation 05/19/17   PT Start Time 2119   PT Stop Time 1740   PT Time Calculation (min) 65 min   Activity Tolerance Patient tolerated treatment well;No increased pain   Behavior During Therapy WFL for tasks assessed/performed      Past Medical History:  Diagnosis Date  . Anxiety   . Arthritis    hands  . Heartburn   . Psoriasis    feet     Past Surgical History:  Procedure Laterality Date  . ANTERIOR CRUCIATE LIGAMENT REPAIR Left   . APPENDECTOMY      There were no vitals filed for this visit.      Subjective Assessment - 05/10/17 1638    Subjective Pt reported she had two good weeks since last session and then 2 weeks ago, she had a flare up and then started to do her exercises. The Sx got better.  Pt also noticed certain foods helped as well.     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  3 vaginal deliveries with episiotomy with her 3rd delivery. Hx of appendectomy. Anxiety with overwhelming feeling of not being able to get things down. This is managed with organized calender. Pt would like to get back to fitness with yoga and pilates tape (21-day fit)              [facility] PT Assessment - 05/10/17 1720      Other:   Other/ Comments genu valgus in warrior II and lateral lunge   thoracic lyphosis, upper trap overuse                      [facility] Adult PT Treatment/Exercise - 05/10/17 1720      Therapeutic Activites    Therapeutic Activities --  see pt instructions      Neuro Re-ed    Neuro Re-ed  Details  see pt instruction                PT Education - 05/10/17 1732    Education provided Yes   Education Details HEP   Person(s) Educated Patient   Methods Explanation;Demonstration;Tactile cues;Verbal cues;Handout   Comprehension Returned demonstration;Verbalized understanding             PT Long Term Goals - 05/10/17 1640      PT LONG TERM GOAL #1   Title Pt will report less episodes of the pressure sensation and frequent urination from 1-2 x month to < 1 x month in order to improve QOL    Time 12   Period Weeks   Status Achieved     PT LONG TERM GOAL #2   Title Pt will improve stool consistency from Type 3-5 to average Type 4 across 75% of the time for 1 week in order to not strain pelvic floor (5/16: 60-70%)    Time 12   Period Weeks   Status Partially Met     PT LONG TERM GOAL #3   Title Pt will demo proper alignment  and coordination techniques with yoga and pilates postures in order to minimize relapse of Sx    Time 12   Period Weeks   Status On-going     PT LONG TERM GOAL #4   Title Pt will void every 2 hrs instead of holding her bladder to urinate only 1-2 x day across 8 hr day at work in order to better her Sx and normalize pelvic floor function   Time 12   Period Weeks   Status Achieved     PT LONG TERM GOAL #5   Title Pt will report no tenderness over bladder area and R ischiocavernosus /obt internus  with palpation across 2 visits in order to decrease pain   Time 12   Period Weeks   Status Achieved     PT LONG TERM GOAL #6   Title Pt will demo decreased thoracic tensions/ kyphosis and no lumbopelvic perturbations with leg movements in supine in order to demo increased intraabdominal pressure for proper pelvic floor function    Time 12   Period Weeks   Status Achieved     PT LONG TERM GOAL #7   Title Pt will decrease her NIH -CPSI  score from 40%  to < 30% in order to improve QOL. (4/16: 16% )    Time 12   Period Weeks   Status  Achieved     PT LONG TERM GOAL #8   Title Pt will report no suprapubic pain with sitting across 2 weeks in order to demo self management of pain with HEP and to be able to sit in church and at work   Time 12   Period Weeks   Status Achieved               Plan - 05/10/17 1721    Clinical Impression Statement Pt progressed well across the past month one relapse of pain which she was able to self-manage.  Pt is interested in using yoga for maintanence in the future and today's session was focused on helping pt be prepared and ready for community yoga classes.  Pt showed poor alignment in yoga poses. Pt was able to demo proper alignment and was explained how to select for the right classess for her Sx and wellness goals. Pt will be ready for d/c at next session.    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 sit ups   PT Frequency 1x / week   PT Duration 12 weeks   PT Treatment/Interventions ADLs/Self Care Home Management;Aquatic Therapy;Moist Heat;Traction;Neuromuscular re-education;Balance training;Stair training;Therapeutic exercise;Therapeutic activities;Patient/family education;Manual techniques;Scar mobilization;Manual lymph drainage;Passive range of motion;Taping;Functional mobility training;Energy conservation   Consulted and Agree with Plan of Care Patient      Patient will benefit from skilled therapeutic intervention in order to improve the following deficits and impairments:  Pain, Decreased coordination, Decreased safety awareness, Hypermobility, Postural dysfunction, Decreased range of motion, Decreased endurance, Decreased mobility, Increased muscle spasms, Improper body mechanics  Visit Diagnosis: Other lack of coordination  Other abnormalities of gait and mobility  Muscle weakness (generalized)     Problem List There are no active problems to display for this patient.   Jerl Mina  ,PT, DPT, E-RYT  05/10/2017, 5:44 PM  [place] MAIN [facility] SERVICES [address] [place], [place], [address] Phone: [phone]   Fax:  [phone]  Name: Brenda Miles MRN: [id number] Date of Birth: [birth year]

## 2017-05-10 NOTE — Patient Instructions (Addendum)
Short yoga sequences:  ( hand drawn illustrations with each pose)   ___________________  Chair pose: feet apart under hips, less sway back      Chair pose twist: keep knees parallel to allow for a twist at ribcage only 0-inhale to lengthen spine, exhale then twist _________________  Heel raise: hands in [employer] overhead, lower heels slowly without twerking ankle     Step wide like five pointed star      Goddess pose: wide mini squat  Fore arms close and open    Pivot foot into scissor leg position Interlace hands, shoulders squeeze back      ___________________   Step back like you are on ski tracks:  _ Extended side angle :  Feet are hip width apart, L foot one behind like you are on ski tracks, toes turned 45 deg ,  R knee bent over ankle but not more forward then the ankle.  Make sure 50% weight is in the front foot/leg , 50% weight is the back foot/ leg    Rest R forearm lightly on top of thigh,  L hand on L hip.  Inhale lengthen spine,   Exhale turn navel to the L then the ribcage turns, look at the other wall.  Keep maintaining  50% weight is in the front foot/leg , 50% weight is the back foot/ leg  And make sure the front knee is still pointed in the toe line of the 2nd toe.   3 breaths here.    _ reverse twist  Back toes turned straight       _ warrior III Laser fingers back by side       _________________  Transition to ground:  Half lotus pose  Grounding throught sitting bones and heel  _   _    _   Butterfly         On your back :  Happy baby pose    Svasana (corpse)

## 2017-05-18 ENCOUNTER — Ambulatory Visit: Payer: BC Managed Care – PPO | Admitting: Physical Therapy

## 2017-05-18 DIAGNOSIS — R2689 Other abnormalities of gait and mobility: Secondary | ICD-10-CM

## 2017-05-18 DIAGNOSIS — R278 Other lack of coordination: Secondary | ICD-10-CM

## 2017-05-18 DIAGNOSIS — M6281 Muscle weakness (generalized): Secondary | ICD-10-CM

## 2017-05-18 NOTE — Therapy (Signed)
[place] MAIN [facility] SERVICES [address] [place], [place], [address] Phone: [phone]   Fax:  [phone]  Physical Therapy Treatment  Patient Details  Name: Brenda Miles MRN: [id number] Date of Birth: 1971/11/16 Referring Provider: Ernestine Conrad  Encounter Date: 05/18/2017      PT End of Session - 05/18/17 1803    Visit Number 7   Number of Visits 12   Date for PT Re-Evaluation 05/19/17   PT Start Time 8676   PT Stop Time 1950   PT Time Calculation (min) 60 min   Activity Tolerance Patient tolerated treatment well;No increased pain   Behavior During Therapy WFL for tasks assessed/performed      Past Medical History:  Diagnosis Date  . Anxiety   . Arthritis    hands  . Heartburn   . Psoriasis    feet     Past Surgical History:  Procedure Laterality Date  . ANTERIOR CRUCIATE LIGAMENT REPAIR Left   . APPENDECTOMY      There were no vitals filed for this visit.      Subjective Assessment - 05/18/17 1800    Subjective Pt reported practicing her HEP. 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  3 vaginal deliveries with episiotomy with her 3rd delivery. Hx of appendectomy. Anxiety with overwhelming feeling of not being able to get things down. This is managed with organized calender. Pt would like to get back to fitness with yoga and pilates tape (21-day fit)              [facility] PT Assessment - 05/18/17 1801      Observation/Other Assessments   Observations genu valgus in warrior I, II      Coordination   Gross Motor Movements are Fluid and Coordinated --  lumbar lordosis in plank, elbow plank with more stability      Other:   Other/ Comments upper trap overractivation with transition between downward facing dog > plank> upper facing dog with increased lordosis. Modifications yielded less upper trap overuse and more postural stability                       [facility] Adult PT Treatment/Exercise - 05/18/17 1802      Therapeutic Activites    Therapeutic Activities --  modifications to community yoga class postures      Neuro Re-ed    Neuro Re-ed Details  alignment and scapular stabilization cues and knee alignment cues                      PT Long Term Goals - 05/18/17 1812      PT LONG TERM GOAL #1   Title Pt will report less episodes of the pressure sensation and frequent urination from 1-2 x month to < 1 x month in order to improve QOL    Time 12   Period Weeks   Status Achieved     PT LONG TERM GOAL #2   Title Pt will improve stool consistency from Type 3-5 to average Type 4 across 75% of the time for 1 week in order to not strain pelvic floor (5/16: 60-70%)    Time 12   Period Weeks   Status Partially Met     PT LONG TERM GOAL #3   Title Pt will demo proper alignment and coordination  techniques with yoga and pilates postures in order to minimize relapse of Sx    Time 12   Period Weeks   Status On-going     PT LONG TERM GOAL #4   Title Pt will void every 2 hrs instead of holding her bladder to urinate only 1-2 x day across 8 hr day at work in order to better her Sx and normalize pelvic floor function   Time 12   Period Weeks   Status Achieved     PT LONG TERM GOAL #5   Title Pt will report no tenderness over bladder area and R ischiocavernosus /obt internus  with palpation across 2 visits in order to decrease pain   Time 12   Period Weeks   Status Achieved     PT LONG TERM GOAL #6   Title Pt will demo decreased thoracic tensions/ kyphosis and no lumbopelvic perturbations with leg movements in supine in order to demo increased intraabdominal pressure for proper pelvic floor function    Time 12   Period Weeks   Status Achieved     PT LONG TERM GOAL #7   Title Pt will decrease her NIH -CPSI  score from 40%  to < 30% in order to improve QOL. (4/16: 16% )    Time 12   Period  Weeks   Status Achieved     PT LONG TERM GOAL #8   Title Pt will report no suprapubic pain with sitting across 2 weeks in order to demo self management of pain with HEP and to be able to sit in church and at work   Time 12   Period Weeks   Status Achieved               Plan - 05/18/17 1807    Clinical Impression Statement Since SOC, pt has improved urinary and bowel function and less pelvic pain. Pt has one remaining goal that she is close to achieving. Pt continues to show improved posture and better self-management of pain. Today pt required excessive cues for alignment and co-activation of proper upper and lower body muscles in fitness/yoga postures. Cues were targeted to help pt  minimize strain on her pelvic floor mm. Yoga postures were modified to address pt's issues and her desire to use yoga for health and wellness after d/c from PT.  Pt demo'd form correctly following Tx. Pt is ready to try community yoga class but based on her poor neuromuscular control and her hypermobility,pt is not fit for d/c yet from PT.  Pt will return to PT for one more visit to ensure pt feels confident with integration into fitness routine for maintainence and prevention of Sx long term.    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 sit ups   PT Frequency 1x / week   PT Duration 8 weeks   PT Treatment/Interventions ADLs/Self Care Home Management;Aquatic Therapy;Moist Heat;Traction;Neuromuscular re-education;Balance training;Stair training;Therapeutic exercise;Therapeutic activities;Patient/family education;Manual techniques;Scar mobilization;Manual lymph drainage;Passive range of motion;Taping;Functional mobility training;Energy conservation   Consulted and Agree with Plan of Care Patient      Patient will benefit from skilled therapeutic intervention in order to improve the following deficits and impairments:  Pain, Decreased  coordination, Decreased safety awareness, Hypermobility, Postural dysfunction, Decreased range of motion, Decreased endurance, Decreased mobility, Increased muscle spasms, Improper body mechanics  Visit Diagnosis: Other lack of coordination  Other abnormalities of gait and mobility  Muscle weakness (generalized)  Problem List There are no active problems to display for this patient.   Jerl Mina ,PT, DPT, E-RYT  05/19/2017, 12:36 PM  [place] MAIN [facility] SERVICES [address] [place], [place], [address] Phone: [phone]   Fax:  [phone]  Name: Wyvonne Carda MRN: [id number] Date of Birth: 1971/05/25

## 2017-05-18 NOTE — Patient Instructions (Signed)
Instead of plank--> go on elbows plank  Instead of upward facing dog > low cobra to  Decrease low back pain   Ways to stretch R groin area: Seated on floor, heels to one side, rocking from one sitting bones to the other  Quad stretch on sidelying  Quad stretch  On elbows ( lower to minimize low back sway) with strap

## 2017-06-07 ENCOUNTER — Ambulatory Visit: Payer: BC Managed Care – PPO | Attending: Urology | Admitting: Physical Therapy

## 2017-06-07 DIAGNOSIS — R278 Other lack of coordination: Secondary | ICD-10-CM | POA: Diagnosis present

## 2017-06-07 DIAGNOSIS — M6281 Muscle weakness (generalized): Secondary | ICD-10-CM | POA: Insufficient documentation

## 2017-06-07 DIAGNOSIS — R2689 Other abnormalities of gait and mobility: Secondary | ICD-10-CM | POA: Insufficient documentation

## 2017-06-07 NOTE — Therapy (Signed)
[place] MAIN [facility] SERVICES [address] [place], [place], [address] Phone: [phone]   Fax:  [phone]  Physical Therapy Treatment / Discharge Summary   Patient Details  Name: Paula Busenbark MRN: [id number] Date of Birth: 1971/01/07 Referring Provider: Ernestine Conrad  Encounter Date: 06/07/2017      PT End of Session - 06/07/17 1359    Visit Number 8   Number of Visits 12   Date for PT Re-Evaluation 07/03/17   PT Start Time 4562   PT Stop Time 1403   PT Time Calculation (min) 46 min   Activity Tolerance Patient tolerated treatment well;No increased pain   Behavior During Therapy WFL for tasks assessed/performed      Past Medical History:  Diagnosis Date  . Anxiety   . Arthritis    hands  . Heartburn   . Psoriasis    feet     Past Surgical History:  Procedure Laterality Date  . ANTERIOR CRUCIATE LIGAMENT REPAIR Left   . APPENDECTOMY      There were no vitals filed for this visit.      Subjective Assessment - 06/07/17 1321    Subjective Pt reported she had tightness in the pelvic area during this week which has been a stressful week.  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  3 vaginal deliveries with episiotomy with her 3rd delivery. Hx of appendectomy. Anxiety with overwhelming feeling of not being able to get things down. This is managed with organized calender. Pt would like to get back to fitness with yoga and pilates tape (21-day fit)              [facility] PT Assessment - 06/07/17 1358      Observation/Other Assessments   Observations signifcaintly improved posture, less midback tensions and upper trap tensions      Other:   Other/ Comments required cues for stability and points of contact in yoga postures, emphasized ways to prevent injuries with hypermobility                      [facility] Adult PT  Treatment/Exercise - 06/07/17 1358      Therapeutic Activites    Therapeutic Activities --  modifications to community yoga class postures      Neuro Re-ed    Neuro Re-ed Details  see pt instruction                PT Education - 06/07/17 1402    Education provided Yes   Education Details d/c and stress management for minimize relapse of tightness    Person(s) Educated Patient   Methods Explanation;Demonstration;Tactile cues;Verbal cues;Handout   Comprehension Returned demonstration;Verbalized understanding             PT Long Term Goals - 06/07/17 1359      PT LONG TERM GOAL #1   Title Pt will report less episodes of the pressure sensation and frequent urination from 1-2 x month to < 1 x month in order to improve QOL    Time 12   Period Weeks   Status Achieved     PT LONG TERM GOAL #2   Title Pt will improve stool consistency from Type 3-5 to average Type 4 across 75% of the time for 1 week in order to not strain pelvic floor (5/16: 60-70%)    Time  12   Period Weeks   Status Achieved     PT LONG TERM GOAL #3   Title Pt will demo proper alignment and coordination techniques with yoga and pilates postures in order to minimize relapse of Sx    Time 12   Period Weeks   Status Achieved     PT LONG TERM GOAL #4   Title Pt will void every 2 hrs instead of holding her bladder to urinate only 1-2 x day across 8 hr day at work in order to better her Sx and normalize pelvic floor function   Time 12   Period Weeks   Status Achieved     PT LONG TERM GOAL #5   Title Pt will report no tenderness over bladder area and R ischiocavernosus /obt internus  with palpation across 2 visits in order to decrease pain   Time 12   Period Weeks   Status Achieved     PT LONG TERM GOAL #6   Title Pt will demo decreased thoracic tensions/ kyphosis and no lumbopelvic perturbations with leg movements in supine in order to demo increased intraabdominal pressure for proper pelvic floor  function    Time 12   Period Weeks   Status Achieved     PT LONG TERM GOAL #7   Title Pt will decrease her NIH -CPSI  score from 40%  to < 30% in order to improve QOL. (4/16: 16% )    Time 12   Period Weeks   Status Achieved     PT LONG TERM GOAL #8   Title Pt will report no suprapubic pain with sitting across 2 weeks in order to demo self management of pain with HEP and to be able to sit in church and at work   Time 12   Period Weeks   Status Achieved               Plan - 06/07/17 1359    Clinical Impression Statement Pt has achieved 8/8 goals with significantly less epsiodes of pubic pain/ frequent urination, pelvic floor tightness, improved bowel movements with better formed stools, and less forward head posture.  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  Pt feels confident and shows proper alignment and technique with yoga poses to minimzie risk for injuries when she attends yoga classes for health and prevention. Pt also shows self-management of pain with relaxation and stretching routine.  Pt is ready for d/c at this.       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 sit ups   PT Frequency 1x / week   PT Duration 8 weeks   PT Treatment/Interventions ADLs/Self Care Home Management;Aquatic Therapy;Moist Heat;Traction;Neuromuscular re-education;Balance training;Stair training;Therapeutic exercise;Therapeutic activities;Patient/family education;Manual techniques;Scar mobilization;Manual lymph drainage;Passive range of motion;Taping;Functional mobility training;Energy conservation   Consulted and Agree with Plan of Care Patient      Patient will benefit from skilled therapeutic intervention in order to improve the following deficits and impairments:  Pain, Decreased coordination, Decreased safety awareness, Hypermobility, Postural dysfunction, Decreased range of motion,  Decreased endurance, Decreased mobility, Increased muscle spasms, Improper body mechanics  Visit Diagnosis: Other lack of coordination  Other abnormalities of gait and mobility  Muscle weakness (generalized)     Problem List There are no active problems to display for this patient.   Jerl Mina ,PT, DPT, E-RYT  06/07/2017, 2:12 PM  [place]  [place], [place], [address] Phone: [phone]   Fax:  [phone]  Name: Brooksie Ellwanger MRN: [id number] Date of Birth: [birth year]

## 2017-06-07 NOTE — Patient Instructions (Signed)
Locust pose:  Finger tips shooting back, palm face hips shoulders together and down   3 reps      Low cobra: Stay lower for midback extension not low back       Gate pose   Feet and knees aligned to where your hips are open

## 2019-05-27 ENCOUNTER — Other Ambulatory Visit
Admission: RE | Admit: 2019-05-27 | Discharge: 2019-05-27 | Disposition: A | Discharge status: Home / Self Care | Payer: BC Managed Care – PPO | Source: Ambulatory Visit | Attending: Gastroenterology | Admitting: Gastroenterology

## 2019-05-27 DIAGNOSIS — R197 Diarrhea, unspecified: Secondary | ICD-10-CM | POA: Diagnosis not present

## 2019-05-27 DIAGNOSIS — R1032 Left lower quadrant pain: Secondary | ICD-10-CM | POA: Diagnosis present

## 2019-05-27 DIAGNOSIS — R1031 Right lower quadrant pain: Secondary | ICD-10-CM | POA: Insufficient documentation

## 2019-05-27 LAB — GASTROINTESTINAL PANEL BY PCR, STOOL (REPLACES STOOL CULTURE)

## 2019-05-27 LAB — C DIFFICILE QUICK SCREEN W PCR REFLEX: C Diff antigen: NEGATIVE

## 2019-05-27 LAB — C DIFFICILE QUICK SCREEN W PCR REFLEX??
C Diff interpretation: NOT DETECTED
C Diff toxin: NEGATIVE

## 2019-05-30 LAB — CALPROTECTIN, FECAL: Calprotectin, Fecal: 63 ug/g (ref 0–120)

## 2019-05-30 LAB — PANCREATIC ELASTASE, FECAL: Pancreatic Elastase-1, Stool: >500 ug Elast./g (ref >200)

## 2020-02-23 ENCOUNTER — Ambulatory Visit: Payer: BC Managed Care – PPO | Attending: Internal Medicine

## 2020-02-23 DIAGNOSIS — Z23 Encounter for immunization: Secondary | ICD-10-CM | POA: Insufficient documentation

## 2020-02-23 NOTE — Progress Notes (Signed)
   Covid-19 Vaccination Clinic  Name:  Brenda Miles    MRN: [id number] DOB: [birth year]  02/23/2020  Brenda Miles was observed post Covid-19 immunization for 15 minutes without incidence. She was provided with Vaccine Information Sheet and instruction to access the V-Safe system.   Brenda Miles was instructed to call 911 with any severe reactions post vaccine: Marland Kitchen Difficulty breathing  . Swelling of your face and throat  . A fast heartbeat  . A bad rash all over your body  . Dizziness and weakness    Immunizations Administered    Name Date Dose VIS Date Route   Pfizer COVID-19 Vaccine 02/23/2020 12:53 PM 0.3 mL 12/06/2019 Intramuscular   Manufacturer: [place]   Lot: [id number]   [place]: [id number]

## 2020-03-16 ENCOUNTER — Ambulatory Visit: Payer: Self-pay | Admitting: *Deleted

## 2020-03-16 NOTE — Telephone Encounter (Signed)
Pt is scheduled for 2nd vaccine tomorrow and husband tested positive covid yesterday. Appt rescheduled 14 days out. Reviewed quarantine guidelines with pt, verbalizes understanding.   Reason for Disposition . Health Information question, no triage required and triager able to answer question  Answer Assessment - Initial Assessment Questions 1. REASON FOR CALL or QUESTION: "What is your reason for calling today?" or "How can I best help you?" or "What question do you have that I can help answer?"     Covid related questions  Protocols used: [place]

## 2020-03-17 ENCOUNTER — Ambulatory Visit: Payer: BC Managed Care – PPO

## 2020-04-01 ENCOUNTER — Ambulatory Visit: Payer: BC Managed Care – PPO | Attending: Internal Medicine

## 2020-04-01 DIAGNOSIS — Z23 Encounter for immunization: Secondary | ICD-10-CM

## 2020-04-01 NOTE — Progress Notes (Signed)
   Covid-19 Vaccination Clinic  Name:  Brenda Miles    MRN: [id number] DOB: Oct 03, 1971  04/01/2020  [name] was observed post Covid-19 immunization for 15 minutes without incident. She was provided with Vaccine Information Sheet and instruction to access the V-Safe system.   [name] was instructed to call 911 with any severe reactions post vaccine: Marland Kitchen Difficulty breathing  . Swelling of face and throat  . A fast heartbeat  . A bad rash all over body  . Dizziness and weakness   Immunizations Administered    Name Date Dose VIS Date Route   Pfizer COVID-19 Vaccine 04/01/2020 11:20 AM 0.3 mL 12/06/2019 Intramuscular   Manufacturer: [place]   Lot: [id number]   [place]: [id number]

## 2020-06-22 ENCOUNTER — Ambulatory Visit: Payer: BC Managed Care – PPO | Attending: Internal Medicine

## 2020-06-22 DIAGNOSIS — Z20822 Contact with and (suspected) exposure to covid-19: Secondary | ICD-10-CM

## 2020-06-23 LAB — NOVEL CORONAVIRUS, NAA: SARS-CoV-2, NAA: NOT DETECTED

## 2020-06-23 LAB — SARS-COV-2, NAA 2 DAY TAT

## 2022-05-13 ENCOUNTER — Other Ambulatory Visit: Payer: Self-pay

## 2022-05-13 ENCOUNTER — Emergency Department: Payer: BC Managed Care – PPO

## 2022-05-13 ENCOUNTER — Emergency Department
Admission: EM | Admit: 2022-05-13 | Discharge: 2022-05-15 | Disposition: A | Discharge status: Home / Self Care | Payer: BC Managed Care – PPO | Attending: Emergency Medicine | Admitting: Emergency Medicine

## 2022-05-13 ENCOUNTER — Encounter: Payer: Self-pay | Admitting: Intensive Care

## 2022-05-13 DIAGNOSIS — M7989 Other specified soft tissue disorders: Secondary | ICD-10-CM | POA: Insufficient documentation

## 2022-05-13 HISTORY — DX: Basal cell carcinoma of skin, unspecified: C44.91

## 2022-05-13 LAB — CBC WITH DIFFERENTIAL/PLATELET
Abs Immature Granulocytes: 0.01 10*3/uL (ref 0.00–0.07)
Basophils Absolute: 0.1 10*3/uL (ref 0.0–0.1)
Basophils Relative: 2 %
Eosinophils Absolute: 0.2 10*3/uL (ref 0.0–0.5)
Eosinophils Relative: 3 %
HCT: 40.4 % (ref 36.0–46.0)
Hemoglobin: 13.4 g/dL (ref 12.0–15.0)
Immature Granulocytes: 0 %
Lymphocytes Relative: 24 %
Lymphs Abs: 2 10*3/uL (ref 0.7–4.0)
MCH: 29.7 pg (ref 26.0–34.0)
MCHC: 33.2 g/dL (ref 30.0–36.0)
MCV: 89.6 fL (ref 80.0–100.0)
Monocytes Absolute: 0.4 10*3/uL (ref 0.1–1.0)
Monocytes Relative: 5 %
Neutro Abs: 5.3 10*3/uL (ref 1.7–7.7)
Neutrophils Relative %: 66 %
Platelets: 315 10*3/uL (ref 150–400)
RBC: 4.51 MIL/uL (ref 3.87–5.11)
RDW: 12.2 % (ref 11.5–15.5)
WBC: 8 10*3/uL (ref 4.0–10.5)
nRBC: 0 % (ref 0.0–0.2)

## 2022-05-13 LAB — COMPREHENSIVE METABOLIC PANEL
ALT: 20 U/L (ref 0–44)
AST: 18 U/L (ref 15–41)
Albumin: 4.1 g/dL (ref 3.5–5.0)
Alkaline Phosphatase: 70 U/L (ref 38–126)
Anion gap: 8 (ref 5–15)
BUN: 15 mg/dL (ref 6–20)
CO2: 28 mmol/L (ref 22–32)
Calcium: 9.1 mg/dL (ref 8.9–10.3)
Chloride: 101 mmol/L (ref 98–111)
Creatinine, Ser: 0.74 mg/dL (ref 0.44–1.00)
GFR, Estimated: >60 mL/min (ref >60)
Glucose, Bld: 90 mg/dL (ref 70–99)
Potassium: 3.7 mmol/L (ref 3.5–5.1)
Sodium: 137 mmol/L (ref 135–145)
Total Bilirubin: 0.5 mg/dL (ref 0.3–1.2)
Total Protein: 7.3 g/dL (ref 6.5–8.1)

## 2022-05-13 LAB — MAGNESIUM: Magnesium: 2.1 mg/dL (ref 1.7–2.4)

## 2022-05-13 LAB — BRAIN NATRIURETIC PEPTIDE: B Natriuretic Peptide: 17 pg/mL (ref 0.0–100.0)

## 2022-05-13 LAB — CK: Total CK: 75 U/L (ref 38–234)

## 2022-05-13 NOTE — ED Notes (Signed)
Patient transported to Ultrasound 

## 2022-05-13 NOTE — ED Triage Notes (Signed)
Patient presents with left leg swelling. Painful at upper thigh

## 2022-05-13 NOTE — ED Provider Notes (Signed)
[facility] Provider Note    Event Date/Time   First MD Initiated Contact with Patient 05/13/22 1818     (approximate)   History   Leg Swelling (left)   HPI  Ashiya Kinkead is a 51 y.o. female who comes in with concern for some swelling in the upper thigh.  Patient reports noticing over the past 2 months a little bit of swelling noted in the left upper thigh.  She started having a little bit of tingling going down the legs.  She reports that then when she tries to move it the tingling goes away.  It is intermittent.  She denies any weakness and is still ambulatory on the leg.  Denies this ever happening previously.  Denies any back pain, chest pain, shortness of breath or other symptoms.  She denies any urinary or bladder incontinence.  Orts the pain starts in her left groin and then goes down the front of her leg as well as the tingling sensation but that if she then starts to move the tingling goes away.   Physical Exam   Triage Vital Signs: ED Triage Vitals  Enc Vitals Group     BP 05/13/22 1655 (!) 153/79     Pulse Rate 05/13/22 1655 78     Resp 05/13/22 1655 16     Temp 05/13/22 1655 98.8 F (37.1 C)     Temp Source 05/13/22 1655 Oral     SpO2 05/13/22 1655 96 %     Weight 05/13/22 1642 173 lb (78.5 kg)     Height 05/13/22 1642 '5\' 6"'$  (1.676 m)     Head Circumference --      Peak Flow --      Pain Score 05/13/22 1642 2     Pain Loc --      Pain Edu? --      Excl. in [place]? --     Most recent vital signs: Vitals:   05/13/22 1655  BP: (!) 153/79  Pulse: 78  Resp: 16  Temp: 98.8 F (37.1 C)  SpO2: 96%     General: Awake, no distress.  CV:  Good peripheral perfusion.  Resp:  Normal effort.  Abd:  No distention.  Other:  Patient is got good distal pulse.  There is no obvious swelling but patient reports that when she measured it it was 2 cm larger on the upper thigh.  She has no redness or warmth noted.  She still able to ambulate.  She is  got good strength in the leg.  She has no midline back tenderness or back tenderness in general.  I did measure both upper thighs and they were equal at this time.  ED Results / Procedures / Treatments   Labs (all labs ordered are listed, but only abnormal results are displayed) Labs Reviewed  CBC WITH DIFFERENTIAL/PLATELET  MAGNESIUM  COMPREHENSIVE METABOLIC PANEL  CK     RADIOLOGY I have reviewed the ultrasound personally and interpreted and there is no evidence of DVT  I reviewed the x-ray personally interpreted it and there is no evidence of fracture or mass  PROCEDURES:  Critical Care performed: No  Procedures   MEDICATIONS ORDERED IN ED: Medications - No data to display   IMPRESSION / MDM / [place] / ED COURSE  I reviewed the triage vital signs and the nursing notes.   Patient comes in with some left leg pain.  Denies any obvious swelling on examination although she  reports that measures a little bit differently.  Do not feel any warmth or redness to suggest abscess or cellulitis.  Ultrasound ordered and there is no evidence of DVT.  We will add some labs to look for any electrolyte abnormalities as well as get an x-ray to look for any underlying mass.  Patient has no symptoms of cord compression.  No evidence of chest pain or shortness of breath to suggest dissection and this is been ongoing for 2 months.   CK normal.  CBC normal.  Magnesium normal CMP normal   Xray negative  I remeasured the leg myself and they are currently equaling the same.  We discussed CT imaging but given the leg circumference are now equal patient does report that it seems a little bit larger after she has been up standing on her feet.  She denies any shortness of breath to suggest CHF.  I will add on BNP.  I suspect that this is more likely venous stasis in nature and potentially a muscle strain or pinched nerve.  Patient feels comfortable with discharge home at this time and can  follow-up with orthopedics if she continues to have symptoms but we discussed some ibuprofen to help with pain.  Pt denies any concern for pregnancy and declined preg test.    FINAL CLINICAL IMPRESSION(S) / ED DIAGNOSES   Final diagnoses:  Leg swelling     Rx / DC Orders   ED Discharge Orders     None        Note:  This document was prepared using Dragon voice recognition software and may include unintentional dictation errors.   Vanessa [place], MD 05/13/22 [phone]

## 2022-05-13 NOTE — Discharge Instructions (Signed)
Your x-ray was reassuring your blood work is reassuring.  Take ibuprofen 600 every 6-8 hours or return to the ER if develop redness, worsening swelling or any other concerns otherwise follow-up with your orthopedic doctor and return here if you develop new symptoms.

## 2022-06-24 ENCOUNTER — Ambulatory Visit
Admission: EM | Admit: 2022-06-24 | Discharge: 2022-06-24 | Disposition: A | Discharge status: Home / Self Care | Payer: BC Managed Care – PPO

## 2022-06-24 ENCOUNTER — Encounter: Payer: Self-pay | Admitting: Emergency Medicine

## 2022-06-24 DIAGNOSIS — K219 Gastro-esophageal reflux disease without esophagitis: Secondary | ICD-10-CM | POA: Diagnosis not present

## 2022-06-24 MED ORDER — LIDOCAINE VISCOUS HCL 2 % MT SOLN
15.0000 mL | Freq: Once | OROMUCOSAL | Status: AC
  Administered 2022-06-24: 15 mL via ORAL

## 2022-06-24 MED ORDER — ALUM & MAG HYDROXIDE-SIMETH 200-200-20 MG/5ML PO SUSP
30.0000 mL | Freq: Once | ORAL | Status: AC
  Administered 2022-06-24: 30 mL via ORAL

## 2022-06-24 NOTE — ED Provider Notes (Signed)
[facility]    CSN: [id number] Arrival date & time: 06/24/22  1708      History   Chief Complaint Chief Complaint  Patient presents with   Chest Pain    HPI Lianna Sitzmann is a 51 y.o. female.   Patient presents with constant centralized chest pain waking her up from sleep this morning.  Pain does not radiate, described as stabbing and pressure, rating it a 5 out of 10.  Vomiting began shortly after, has vomited numerous times today, unable to tolerate solids or liquids.  Initially thought symptoms were related to GERD, has attempted use of Gas-X, Tums, Pepto-Bismol which have been ineffective.  Taking daily Prilosec and Zantac.  No known sick contacts but recently returned from [facility] last night.  Denies cardiac history, denies familial history.  Denies dizziness, lightheadedness, weakness, memory or speech changes, visual disturbance, headaches, palpitations, tachycardia, shortness of breath.    Past Medical History:  Diagnosis Date   Anxiety    Arthritis    hands   Heartburn    Psoriasis    feet    Skin cancer, basal cell     There are no problems to display for this patient.   Past Surgical History:  Procedure Laterality Date   ANTERIOR CRUCIATE LIGAMENT REPAIR Left    APPENDECTOMY      OB History   No obstetric history on file.      Home Medications    Prior to Admission medications   Medication Sig Start Date End Date Taking? Authorizing Provider  DULoxetine (CYMBALTA) 30 MG capsule Take by mouth. 10/18/16  Yes [provider]  esomeprazole (NEXIUM) 20 MG capsule Take 20 mg by mouth daily at 12 noon.   Yes [provider]  SAXENDA 18 MG/3ML SOPN Inject into the skin. 06/23/22  Yes [provider]  Cranberry 400 MG CAPS Take by mouth.    [provider]  ferrous sulfate 325 (65 FE) MG tablet Take by mouth.    [provider]  Omega-3 Fatty Acids (FISH OIL PO) Take by mouth.    [provider]  Probiotic Product (PROBIOTIC ADVANCED PO) Take by mouth.    [provider]    Family History Family History  Problem Relation Age of Onset   Hypothyroidism Mother    Diabetes Father    Hypothyroidism Brother    Diabetes Daughter    Prostate cancer Neg Hx    Kidney cancer Neg Hx    Bladder Cancer Neg Hx     Social History Social History   Tobacco Use   Smoking status: Never   Smokeless tobacco: Never  Vaping Use   Vaping Use: Never used  Substance Use Topics   Alcohol use: Yes    Comment: occ   Drug use: No     Allergies   Patient has no known allergies.   Review of Systems Review of Systems  Constitutional: Negative.   HENT: Negative.    Respiratory: Negative.    Cardiovascular:  Positive for chest pain. Negative for palpitations and leg swelling.  Gastrointestinal:  Positive for vomiting. Negative for abdominal distention, abdominal pain, anal bleeding, blood in stool, constipation, diarrhea, nausea and rectal pain.  Musculoskeletal: Negative.   Skin: Negative.   Neurological: Negative.      Physical Exam Triage Vital Signs ED Triage Vitals  Enc Vitals Group     BP 06/24/22 1715 132/80     Pulse Rate 06/24/22 1715 71  Resp 06/24/22 1715 14     Temp 06/24/22 1715 98.2 F (36.8 C)     Temp Source 06/24/22 1715 Oral     SpO2 06/24/22 1715 99 %     Weight 06/24/22 1712 170 lb (77.1 kg)     Height 06/24/22 1712 '5\' 6"'$  (1.676 m)     Head Circumference --      Peak Flow --      Pain Score 06/24/22 1712 5     Pain Loc --      Pain Edu? --      Excl. in [place]? --    No data found.  Updated Vital Signs BP 132/80 (BP Location: Left Arm)   Pulse 71   Temp 98.2 F (36.8 C) (Oral)   Resp 14   Ht '5\' 6"'$  (1.676 m)   Wt 170 lb (77.1 kg)   SpO2 99%   BMI 27.44 kg/m   Visual Acuity Right Eye Distance:   Left Eye Distance:   Bilateral Distance:    Right Eye Near:   Left Eye Near:    Bilateral Near:     Physical Exam Constitutional:       Appearance: Normal appearance. She is well-developed.  HENT:     Head: Normocephalic.  Eyes:     Extraocular Movements: Extraocular movements intact.  Cardiovascular:     Rate and Rhythm: Normal rate and regular rhythm.     Pulses: Normal pulses.     Heart sounds: Normal heart sounds.  Pulmonary:     Effort: Pulmonary effort is normal.     Breath sounds: Normal breath sounds.  Chest:     Comments: Mild tenderness elicited with palpation to the center of the chest wall between the breastbone, no ecchymosis, swelling or deformity present, chest wall is symmetrical Skin:    General: Skin is warm and dry.  Neurological:     Mental Status: She is alert and oriented to person, place, and time. Mental status is at baseline.  Psychiatric:        Mood and Affect: Mood normal.        Behavior: Behavior normal.      UC Treatments / Results  Labs (all labs ordered are listed, but only abnormal results are displayed) Labs Reviewed - No data to display  EKG   Radiology No results found.  Procedures Procedures (including critical care time)  Medications Ordered in UC Medications - No data to display  Initial Impression / Assessment and Plan / UC Course  I have reviewed the triage vital signs and the nursing notes.  Pertinent labs & imaging results that were available during my care of the patient were reviewed by me and considered in my medical decision making (see chart for details).  GERD without esophagitis  Vital signs are stable and well visibly uncomfortable patient is in no signs of distress, EKG showing normal sinus rhythm, low suspicion for cardiac involvement at this time Maalox with lidocaine given in office, on 15-minute reevaluation patient endorses that pain is still centralized but has moved upward and now feels as if she can burp, given second dose of Maalox with lidocaine and coke to sit on in office, on 15-minute reevaluation patient endorses that symptoms  have begun to subside, recommended continued use of daily Prilosec and Zantac as well as additional over-the-counter medications as needed, recommended bland diet until symptoms have resolved avoiding spicy, greasy or heavier foods like to prevent further irritation, may follow-up with his  urgent care as needed if symptoms persist or worsen Final Clinical Impressions(s) / UC Diagnoses   Final diagnoses:  None   Discharge Instructions   None    ED Prescriptions   None    PDMP not reviewed this encounter.   Hans Eden, NP 06/24/22 1900

## 2022-06-24 NOTE — ED Triage Notes (Signed)
Patient c/o chest pain that started around 5 am this morning.  Patient reports vomiting around 10 am.  Patient reports some SOB.

## 2022-06-24 NOTE — Discharge Instructions (Addendum)
Your EKG showed that your heart was beating in a normal rhythm and pace, your lungs are clear and your pain has started to subside after use of medications that are for heartburn and indigestion therefore I have a very low suspicion that any of this is related to your heart today  Continue to take your Prilosec and Zantac daily as directed  Continue use of Tums, Pepto-Bismol or Gas-X as needed for additional comfort  Until your symptoms have completely subsided, try to eat a blander diet, avoid spicy, greasy or heavier foods as they may cause further irritation  Follow-up with this urgent care as needed if symptoms persist or worsen

## 2022-07-22 ENCOUNTER — Emergency Department
Admission: EM | Admit: 2022-07-22 | Discharge: 2022-07-23 | Disposition: A | Discharge status: Home / Self Care | Payer: BC Managed Care – PPO | Attending: Emergency Medicine | Admitting: Emergency Medicine

## 2022-07-22 ENCOUNTER — Emergency Department: Payer: BC Managed Care – PPO

## 2022-07-22 ENCOUNTER — Ambulatory Visit
Admission: EM | Admit: 2022-07-22 | Discharge: 2022-07-22 | Disposition: A | Discharge status: Home / Self Care | Payer: BC Managed Care – PPO

## 2022-07-22 ENCOUNTER — Other Ambulatory Visit: Payer: Self-pay

## 2022-07-22 DIAGNOSIS — R1013 Epigastric pain: Secondary | ICD-10-CM | POA: Insufficient documentation

## 2022-07-22 DIAGNOSIS — I10 Essential (primary) hypertension: Secondary | ICD-10-CM | POA: Insufficient documentation

## 2022-07-22 DIAGNOSIS — K8050 Calculus of bile duct without cholangitis or cholecystitis without obstruction: Secondary | ICD-10-CM

## 2022-07-22 DIAGNOSIS — R112 Nausea with vomiting, unspecified: Secondary | ICD-10-CM | POA: Diagnosis not present

## 2022-07-22 DIAGNOSIS — Z85828 Personal history of other malignant neoplasm of skin: Secondary | ICD-10-CM | POA: Insufficient documentation

## 2022-07-22 DIAGNOSIS — R1011 Right upper quadrant pain: Secondary | ICD-10-CM

## 2022-07-22 DIAGNOSIS — K8020 Calculus of gallbladder without cholecystitis without obstruction: Secondary | ICD-10-CM

## 2022-07-22 LAB — URINALYSIS, ROUTINE W REFLEX MICROSCOPIC
Bacteria, UA: NONE SEEN
Bilirubin Urine: NEGATIVE
Glucose, UA: NEGATIVE mg/dL
Ketones, ur: NEGATIVE mg/dL
Leukocytes,Ua: NEGATIVE
Nitrite: NEGATIVE
Protein, ur: 30 mg/dL — AB
RBC / HPF: >50 RBC/hpf — ABNORMAL HIGH (ref 0–5)
Specific Gravity, Urine: 1.019 (ref 1.005–1.030)
WBC, UA: NONE SEEN WBC/hpf (ref 0–5)
pH: 8 (ref 5.0–8.0)

## 2022-07-22 LAB — CBC
HCT: 44.5 % (ref 36.0–46.0)
Hemoglobin: 14.8 g/dL (ref 12.0–15.0)
MCH: 29.4 pg (ref 26.0–34.0)
MCHC: 33.3 g/dL (ref 30.0–36.0)
MCV: 88.5 fL (ref 80.0–100.0)
Platelets: 263 10*3/uL (ref 150–400)
RBC: 5.03 MIL/uL (ref 3.87–5.11)
RDW: 12.4 % (ref 11.5–15.5)
WBC: 9.5 10*3/uL (ref 4.0–10.5)
nRBC: 0 % (ref 0.0–0.2)

## 2022-07-22 LAB — LIPASE, BLOOD: Lipase: 45 U/L (ref 11–51)

## 2022-07-22 LAB — TROPONIN I (HIGH SENSITIVITY)
Troponin I (High Sensitivity): 9 ng/L (ref <18)
Troponin I (High Sensitivity): 11 ng/L (ref <18)

## 2022-07-22 LAB — COMPREHENSIVE METABOLIC PANEL
ALT: 31 U/L (ref 0–44)
AST: 23 U/L (ref 15–41)
Albumin: 4.8 g/dL (ref 3.5–5.0)
Alkaline Phosphatase: 61 U/L (ref 38–126)
Anion gap: 8 (ref 5–15)
BUN: 14 mg/dL (ref 6–20)
CO2: 29 mmol/L (ref 22–32)
Calcium: 9.1 mg/dL (ref 8.9–10.3)
Chloride: 102 mmol/L (ref 98–111)
Creatinine, Ser: 0.88 mg/dL (ref 0.44–1.00)
GFR, Estimated: >60 mL/min (ref >60)
Glucose, Bld: 118 mg/dL — ABNORMAL HIGH (ref 70–99)
Potassium: 4.3 mmol/L (ref 3.5–5.1)
Sodium: 139 mmol/L (ref 135–145)
Total Bilirubin: 0.7 mg/dL (ref 0.3–1.2)
Total Protein: 7.8 g/dL (ref 6.5–8.1)

## 2022-07-22 LAB — POC URINE PREG, ED: Preg Test, Ur: NEGATIVE

## 2022-07-22 MED ORDER — ONDANSETRON HCL 4 MG/2ML IJ SOLN
4.0000 mg | Freq: Once | INTRAMUSCULAR | Status: AC
  Administered 2022-07-22: 4 mg via INTRAVENOUS
  Filled 2022-07-22: qty 2

## 2022-07-22 MED ORDER — ALUM & MAG HYDROXIDE-SIMETH 200-200-20 MG/5ML PO SUSP
30.0000 mL | Freq: Once | ORAL | Status: AC
  Administered 2022-07-22: 30 mL via ORAL
  Filled 2022-07-22: qty 30

## 2022-07-22 MED ORDER — KETOROLAC TROMETHAMINE 15 MG/ML IJ SOLN
15.0000 mg | Freq: Once | INTRAMUSCULAR | Status: AC
  Administered 2022-07-23: 15 mg via INTRAVENOUS
  Filled 2022-07-22: qty 1

## 2022-07-22 MED ORDER — MORPHINE SULFATE (PF) 4 MG/ML IV SOLN
4.0000 mg | Freq: Once | INTRAVENOUS | Status: AC
  Administered 2022-07-22: 4 mg via INTRAVENOUS
  Filled 2022-07-22: qty 1

## 2022-07-22 NOTE — ED Notes (Signed)
Patient is being discharged from the Urgent Care and sent to the Emergency Department via private vehicle . Per Lillia Carmel NP, patient is in need of higher level of care due to abd pain r/o gallbladder. Patient is aware and verbalizes understanding of plan of care.  Vitals:   07/22/22 1819  BP: 137/77  Pulse: 71  Resp: 18  Temp: 97.8 F (36.6 C)  SpO2: 98%

## 2022-07-22 NOTE — ED Provider Notes (Signed)
[facility]    CSN: [id number] Arrival date & time: 07/22/22  1759      History   Chief Complaint Chief Complaint  Patient presents with   Chest Pain    HPI Brenda Miles is a 51 y.o. female.   HPI  51 year old female here for evaluation of chest pain.  Patient reports that she has been experiencing epigastric pain since about 10:00 this morning that radiates up her esophagus.  It also radiates through to her back and she reports that she is having pain with respirations.  She states that she is also had some sweating and some shortness of breath because it hurts to breathe.  She has had a single episode of emesis and has been experiencing some nausea.  She took Mylanta at home without any improvement.  She was seen for similar symptoms at the end of June and diagnosed with GERD.  She has been taking Pepcid and Zantac at home.  She denies any precipitating factors, sour taste in her mouth, burning in her throat, fever, or diarrhea.  Past Medical History:  Diagnosis Date   Anxiety    Arthritis    hands   Heartburn    Psoriasis    feet    Skin cancer, basal cell     There are no problems to display for this patient.   Past Surgical History:  Procedure Laterality Date   ANTERIOR CRUCIATE LIGAMENT REPAIR Left    APPENDECTOMY      OB History   No obstetric history on file.      Home Medications    Prior to Admission medications   Medication Sig Start Date End Date Taking? Authorizing Provider  DULoxetine (CYMBALTA) 30 MG capsule Take by mouth. 10/18/16  Yes [provider]  ferrous sulfate 325 (65 FE) MG tablet Take by mouth.   Yes [provider]  Omega-3 Fatty Acids (FISH OIL PO) Take by mouth.   Yes [provider]  omeprazole (PRILOSEC) 10 MG capsule Take 10 mg by mouth daily.   Yes [provider]  Probiotic Product (PROBIOTIC ADVANCED PO) Take by mouth.   Yes [provider]  ranitidine (ZANTAC) 300  MG tablet Take 300 mg by mouth at bedtime.   Yes [provider]  SAXENDA 18 MG/3ML SOPN Inject into the skin. 06/23/22  Yes [provider]  Cranberry 400 MG CAPS Take by mouth.    [provider]  esomeprazole (NEXIUM) 20 MG capsule Take 20 mg by mouth daily at 12 noon.    [provider]    Family History Family History  Problem Relation Age of Onset   Hypothyroidism Mother    Diabetes Father    Hypothyroidism Brother    Diabetes Daughter    Prostate cancer Neg Hx    Kidney cancer Neg Hx    Bladder Cancer Neg Hx     Social History Social History   Tobacco Use   Smoking status: Never   Smokeless tobacco: Never  Vaping Use   Vaping Use: Never used  Substance Use Topics   Alcohol use: Yes    Comment: occ   Drug use: No     Allergies   Patient has no known allergies.   Review of Systems Review of Systems  Constitutional:  Negative for fever.  HENT:  Negative for sore throat.   Respiratory:  Positive for shortness of breath. Negative for cough.   Cardiovascular:  Positive for chest pain.  Gastrointestinal:  Positive for abdominal pain, nausea and vomiting. Negative for diarrhea.     Physical Exam Triage Vital Signs ED Triage Vitals  Enc Vitals Group     BP 07/22/22 1819 137/77     Pulse Rate 07/22/22 1819 71     Resp 07/22/22 1819 18     Temp 07/22/22 1819 97.8 F (36.6 C)     Temp src --      SpO2 07/22/22 1819 98 %     Weight --      Height --      Head Circumference --      Peak Flow --      Pain Score 07/22/22 1814 7     Pain Loc --      Pain Edu? --      Excl. in [place]? --    No data found.  Updated Vital Signs BP 137/77 (BP Location: Left Arm)   Pulse 71   Temp 97.8 F (36.6 C)   Resp 18   LMP 07/22/2022 (Exact Date)   SpO2 98%   Visual Acuity Right Eye Distance:   Left Eye Distance:   Bilateral Distance:    Right Eye Near:   Left Eye Near:    Bilateral Near:     Physical Exam Vitals and  nursing note reviewed.  Constitutional:      General: She is in acute distress.     Appearance: Normal appearance. She is not ill-appearing.  HENT:     Head: Normocephalic and atraumatic.  Cardiovascular:     Rate and Rhythm: Normal rate and regular rhythm.     Pulses: Normal pulses.     Heart sounds: Normal heart sounds. No murmur heard.    No friction rub. No gallop.  Pulmonary:     Effort: Pulmonary effort is normal.     Breath sounds: Normal breath sounds. No wheezing, rhonchi or rales.  Chest:     Chest wall: No tenderness.  Abdominal:     Palpations: Abdomen is soft.     Tenderness: There is abdominal tenderness. There is guarding. There is no rebound.  Skin:    General: Skin is warm and dry.     Capillary Refill: Capillary refill takes less than 2 seconds.     Findings: No erythema or rash.  Neurological:     General: No focal deficit present.     Mental Status: She is alert and oriented to person, place, and time.  Psychiatric:        Mood and Affect: Mood normal.        Behavior: Behavior normal.        Thought Content: Thought content normal.        Judgment: Judgment normal.      UC Treatments / Results  Labs (all labs ordered are listed, but only abnormal results are displayed) Labs Reviewed - No data to display  EKG   Radiology No results found.  Procedures Procedures (including critical care time)  Medications Ordered in UC Medications - No data to display  Initial Impression / Assessment and Plan / UC Course  I have reviewed the triage vital signs and the nursing notes.  Pertinent labs & imaging results that were available during my care of the patient were reviewed by me and considered in my medical decision making (see chart for details).  Patient is a pleasant 51 year old female who is in a moderate degree of pain rating her epigastric abdominal pain and 8/10.  She states that her pain has been worsening throughout the day and is constant.   It is associated with radiation through to her back, shortness of breath, sweating, and radiation up her chest.  She denies any sour taste in her mouth or burning in her throat.  She has been taking an acids for control of her GERD and she took a dose of Mylanta before she came in without any improvement of symptoms.  Physical exam shows a benign cardiopulmonary exam with S1-S2 heart sounds with regular rate and rhythm and lung sounds that are clear to auscultation all fields.  Patient has no reproducible chest pain to palpation of either her sternum or left chest.  Abdomen is soft, flat, with epigastric tenderness and marked right upper quadrant tenderness with guarding.  No rebound.  Lower abdomen is benign.  Patient does have her gallbladder and I am concerned with her shortness of breath, right upper quadrant pain, epigastric pain with radiation through to the back that she may be experiencing a gallbladder attack.  For this reason I have suggested she go to the emergency department for imaging of her gallbladder.  She has elected to go to [facility] via POV.   Final Clinical Impressions(s) / UC Diagnoses   Final diagnoses:  Right upper quadrant abdominal pain     Discharge Instructions      Please go to [facility] emergency department for evaluation of your gallbladder.  Please go now.     ED Prescriptions   None    PDMP not reviewed this encounter.   Margarette Canada, NP 07/22/22 [phone]

## 2022-07-22 NOTE — ED Triage Notes (Signed)
Ambulatory to triage from urgent care. C/o epigastric abd pain radiating into " all over my back." Started today apx 11am. Denies urinary sx.  Reports last time she had these sx, apx 1 month, Mayloxx helped.

## 2022-07-22 NOTE — ED Provider Notes (Addendum)
[facility] Provider Note    Event Date/Time   First MD Initiated Contact with Patient 07/22/22 2220     (approximate)   History   Abdominal Pain   HPI  Brenda Miles is a 51 y.o. female with past medical history of GERD who presents with abdominal pain.  Symptoms started around 11 today.  Started with epigastric pain.  She had 2 episodes of emesis no diarrhea has had ongoing nausea no fevers no urinary symptoms she is on her menstrual period currently.  Since getting GI cocktail in the waiting room she now feels that pain is mostly in the right upper quadrant.  Has not eaten since the pain started no history of gallstones, she does still have her gallbladder.     Past Medical History:  Diagnosis Date   Anxiety    Arthritis    hands   Heartburn    Psoriasis    feet    Skin cancer, basal cell     There are no problems to display for this patient.    Physical Exam  Triage Vital Signs: ED Triage Vitals  Enc Vitals Group     BP 07/22/22 1911 139/76     Pulse Rate 07/22/22 1911 70     Resp 07/22/22 1911 20     Temp 07/22/22 1911 97.6 F (36.4 C)     Temp Source 07/22/22 1911 Oral     SpO2 07/22/22 1911 98 %     Weight 07/22/22 1912 170 lb (77.1 kg)     Height 07/22/22 1912 '5\' 6"'$  (1.676 m)     Head Circumference --      Peak Flow --      Pain Score 07/22/22 1912 10     Pain Loc --      Pain Edu? --      Excl. in [place]? --     Most recent vital signs: Vitals:   07/22/22 2223 07/22/22 2224  BP: (!) 160/82   Pulse: 69 69  Resp: 18   Temp:    SpO2: 99% 99%     General: Awake, no distress.  CV:  Good peripheral perfusion.  Resp:  Normal effort.  Abd:  No distention.  Tender to palpation in the right upper quadrant with voluntary guarding Neuro:             Awake, Alert, Oriented x 3  Other:     ED Results / Procedures / Treatments  Labs (all labs ordered are listed, but only abnormal results are displayed) Labs Reviewed   COMPREHENSIVE METABOLIC PANEL - Abnormal; Notable for the following components:      Result Value   Glucose, Bld 118 (*)    All other components within normal limits  URINALYSIS, ROUTINE W REFLEX MICROSCOPIC - Abnormal; Notable for the following components:   Color, Urine YELLOW (*)    APPearance TURBID (*)    Hgb urine dipstick MODERATE (*)    Protein, ur 30 (*)    RBC / HPF >50 (*)    All other components within normal limits  LIPASE, BLOOD  CBC  POC URINE PREG, ED  TROPONIN I (HIGH SENSITIVITY)  TROPONIN I (HIGH SENSITIVITY)     EKG  EKG interpretation performed by myself: NSR, nml axis, nml intervals, no acute ischemic changes    RADIOLOGY  Right upper quadrant ultrasound interpreted by myself shows gallstone in the gallbladder neck  PROCEDURES:  Critical Care performed: No  .1-3 Lead  EKG Interpretation  Performed by: Rada Hay, MD Authorized by: Rada Hay, MD     Interpretation: normal     ECG rate assessment: normal     Rhythm: sinus rhythm     Ectopy: none     Conduction: normal     The patient is on the cardiac monitor to evaluate for evidence of arrhythmia and/or significant heart rate changes.   MEDICATIONS ORDERED IN ED: Medications  morphine (PF) 4 MG/ML injection 4 mg (has no administration in time range)  ondansetron (ZOFRAN) injection 4 mg (has no administration in time range)  alum & mag hydroxide-simeth (MAALOX/MYLANTA) 200-200-20 MG/5ML suspension 30 mL (30 mLs Oral Given 07/22/22 2006)     IMPRESSION / MDM / ASSESSMENT AND PLAN / ED COURSE  I reviewed the triage vital signs and the nursing notes.                              Patient's presentation is most consistent with acute presentation with potential threat to life or bodily function.  Differential diagnosis includes, but is not limited to, biliary colic, cholecystitis, cholangitis, pancreatitis, hepatitis, GERD, peptic ulcer disease  Patient is a 51 year old  female presenting with right upper quadrant pain.  Started as epigastric pain about 12 hours ago has been constant since then.  Had 2 episodes of vomiting is nauseous.  No fevers or chills no urinary symptoms.  She is having GI cocktail the chest and epigastric discomfort seems somewhat improved but now pain localizes to the right upper quadrant.  She is tender in the right upper quadrant on exam with voluntary guarding but abdomen is overall benign.  Let applied notable for hypertension.  Patient's blood work is overall reassuring no leukocytosis normal LFTs and lipase.  Pregnancy test is negative.  Greater than 50 RBCs in the urine is due to patient being on her menstrual period.  My greater suspicion is for cholecystitis will obtain right upper quadrant ultrasound we will give the patient morphine Zofran and fluid bolus.  Patient signed out to oncoming provider pending imaging and reassessment.  Gallbladder ultrasound shows a gallstone in the gallbladder neck with 4 mm of gallbladder wall thickness.  Negative Murphy sign.  I spoke with Dr. Hampton Abbot who recommends trying Toradol see if pain improves.  With her reassuring labs and indeterminant ultrasound, if she is feeling improved could potentially go home and follow-up outpatient.  If not significantly improved or able to tolerate p.o. after Toradol may need admission for HIDA scan.   FINAL CLINICAL IMPRESSION(S) / ED DIAGNOSES   Final diagnoses:  RUQ pain     Rx / DC Orders   ED Discharge Orders     None        Note:  This document was prepared using Dragon voice recognition software and may include unintentional dictation errors.   Rada Hay, MD 07/22/22 2248    Rada Hay, MD 07/22/22 2350    Rada Hay, MD 07/23/22 0001

## 2022-07-22 NOTE — ED Notes (Signed)
Pt reports to [place] tech that she is experiencing CP again, feels like "gas bubble pressure" above epigastrum. EKG repeated and reported to EDP.

## 2022-07-22 NOTE — ED Triage Notes (Signed)
Pt presents with CP starting approx 1000 this morning.  Pain is midsternal/midepigastric, intermittent, goes through to the back and reports difficulty breathing as well as fatigue..  Reports n/v with one episode of emesis.  Has taken Mylanta with no improvement.

## 2022-07-22 NOTE — Discharge Instructions (Addendum)
Please go to [facility] emergency department for evaluation of your gallbladder.  Please go now.

## 2022-07-23 MED ORDER — ONDANSETRON HCL 4 MG/2ML IJ SOLN
4.0000 mg | Freq: Once | INTRAMUSCULAR | Status: AC
Start: 2022-07-23 — End: 2022-07-23
  Administered 2022-07-23: 4 mg via INTRAVENOUS
  Filled 2022-07-23: qty 2

## 2022-07-23 MED ORDER — OXYCODONE-ACETAMINOPHEN 5-325 MG PO TABS: 1.0000 | ORAL_TABLET | ORAL | 0 refills | Status: DC | PRN

## 2022-07-23 MED ORDER — OXYCODONE-ACETAMINOPHEN 5-325 MG PO TABS
1.0000 | ORAL_TABLET | Freq: Once | ORAL | Status: AC
  Administered 2022-07-23: 1 via ORAL
  Filled 2022-07-23: qty 1

## 2022-07-23 MED ORDER — ONDANSETRON 4 MG PO TBDP: 4.0000 mg | ORAL_TABLET | Freq: Three times a day (TID) | ORAL | 0 refills | Status: AC | PRN

## 2022-07-23 MED ORDER — ONDANSETRON 4 MG PO TBDP
4.0000 mg | ORAL_TABLET | Freq: Once | ORAL | Status: AC
  Administered 2022-07-23: 4 mg via ORAL
  Filled 2022-07-23: qty 1

## 2022-07-23 NOTE — Discharge Instructions (Signed)
1. Take medicines as needed for pain & nausea (Percocet/Zofran #30). 2. Clear liquids x 12 hours, then bland diet x 1 week, then slowly advance diet as tolerated. Avoid fatty, greasy, spicy foods and drinks. 3. Return to the ER for worsening symptoms, persistent vomiting, fever, difficulty breathing or other concerns.  

## 2022-07-23 NOTE — ED Notes (Signed)
Pain reduced to 3/10. Tolerated sips of water with a cracker for PO challenge

## 2022-07-23 NOTE — ED Provider Notes (Signed)
-----------------------------------------   1:09 AM on 07/23/2022 -----------------------------------------   Pain down to 3/10 after IV Toradol.  Patient able to tolerate crackers and liquids.  Extended discussion with patient and spouse who would like to try to go home.  I did offer them admission to the hospital if patient is still uncomfortable or nauseous but they would like to go home.  Will discharge home with as needed prescriptions for Percocet and Zofran.  Bland diet restrictions and follow-up general surgery as an outpatient.  Strict return precautions given.  Both verbalized understanding agree with plan of care.   Paulette Blanch, MD 07/23/22 [phone]

## 2022-07-23 NOTE — ED Notes (Signed)
Patient verbalizes understanding of discharge instructions. Opportunity for questioning and answers were provided. Armband removed by staff, pt discharged from ED to home via POV  

## 2022-07-27 ENCOUNTER — Ambulatory Visit: Payer: BC Managed Care – PPO | Admitting: Surgery

## 2022-07-27 ENCOUNTER — Telehealth: Payer: Self-pay | Admitting: Surgery

## 2022-07-27 ENCOUNTER — Encounter: Payer: Self-pay | Admitting: Surgery

## 2022-07-27 VITALS — BP 130/66 | HR 77 | Temp 98.1°F | Ht 66.0 in | Wt 172.0 lb

## 2022-07-27 DIAGNOSIS — K8020 Calculus of gallbladder without cholecystitis without obstruction: Secondary | ICD-10-CM | POA: Diagnosis not present

## 2022-07-27 MED ORDER — ACETAMINOPHEN 500 MG PO TABS: 1000.0000 mg | ORAL_TABLET | Freq: Four times a day (QID) | ORAL | Status: AC | PRN

## 2022-07-27 MED ORDER — OXYCODONE HCL 5 MG PO TABS: 5.0000 mg | ORAL_TABLET | Freq: Four times a day (QID) | ORAL | 0 refills | Status: DC | PRN

## 2022-07-27 NOTE — Progress Notes (Signed)
07/27/2022  Reason for Visit:  Symptomatic cholelithiasis  History of Present Illness: Brenda Miles is a 51 y.o. female presenting for evaluation of symptomatic cholelithiasis.  She presented to the ED on 07/22/22 with epigastric and RUQ pain, associated with nausea and vomiting.  In the ED, she had a GI cocktail which helped with her epigastric pain, but the RUQ pain continued.  She had workup in the ED with normal labs including normal LFTs and WBC and an U/S which showed cholelithiasis, with gallbladder wall thickness of 4 mm.  Her pain improved after a dose of toradol, and she was able to be discharged from the ED with close follow up.    Today, the patient reports that she's still having pain episodes, though not as severe.  She is eating protein shakes to avoid any greasy/fatty foods.  Denies any fevers, but sometimes she feels cold and at others feels hot.  Sometimes she also feels bloated.  The pain radiates towards her back and in the ED it also radiated to her left shoulder and up her chest.  Troponins were negative x 2 and EKG did not show any ischemic changes.  Past Medical History: Past Medical History:  Diagnosis Date   Anxiety    Arthritis    hands   Heartburn    Psoriasis    feet    Skin cancer, basal cell      Past Surgical History: Past Surgical History:  Procedure Laterality Date   ANTERIOR CRUCIATE LIGAMENT REPAIR Left    APPENDECTOMY      Home Medications: Prior to Admission medications   Medication Sig Start Date End Date Taking? Authorizing Provider  acetaminophen (TYLENOL) 500 MG tablet Take 2 tablets (1,000 mg total) by mouth every 6 (six) hours as needed for mild pain. 07/27/22  Yes Judith Demps, Jacqulyn Bath, MD  DULoxetine (CYMBALTA) 30 MG capsule Take by mouth. 10/18/16  Yes [provider]  ferrous sulfate 325 (65 FE) MG tablet Take by mouth.   Yes [provider]  omeprazole (PRILOSEC) 10 MG capsule Take 10 mg by mouth daily.   Yes [provider]  ondansetron (ZOFRAN-ODT) 4 MG disintegrating tablet Take 1 tablet (4 mg total) by mouth every 8 (eight) hours as needed for nausea or vomiting. 07/23/22  Yes Paulette Blanch, MD  oxyCODONE (ROXICODONE) 5 MG immediate release tablet Take 1-2 tablets (5-10 mg total) by mouth every 6 (six) hours as needed for moderate pain or severe pain. 07/27/22 07/27/23 Yes Kellan Raffield, Jacqulyn Bath, MD  Probiotic Product (PROBIOTIC ADVANCED PO) Take by mouth.   Yes [provider]  ranitidine (ZANTAC) 300 MG tablet Take 300 mg by mouth at bedtime.   Yes [provider]  SAXENDA 18 MG/3ML SOPN Inject into the skin. Patient not taking: Reported on 07/27/2022 06/23/22   [provider]    Allergies: No Known Allergies  Social History:  reports that she has never smoked. She has never been exposed to tobacco smoke. She has never used smokeless tobacco. She reports current alcohol use. She reports that she does not use drugs.   Family History: Family History  Problem Relation Age of Onset   Hypothyroidism Mother    Diabetes Father    Hypothyroidism Brother    Diabetes Daughter    Prostate cancer Neg Hx    Kidney cancer Neg Hx    Bladder Cancer Neg Hx     Review of Systems: Review of Systems  Constitutional:  Negative for chills and  fever.  HENT:  Negative for hearing loss.   Respiratory:  Negative for shortness of breath.   Cardiovascular:  Negative for chest pain.  Gastrointestinal:  Positive for abdominal pain, nausea and vomiting. Negative for constipation and diarrhea.  Genitourinary:  Negative for dysuria.  Musculoskeletal:  Positive for back pain.  Skin:  Negative for rash.  Neurological:  Negative for dizziness.  Psychiatric/Behavioral:  Negative for depression.     Physical Exam BP 130/66   Pulse 77   Temp 98.1 F (36.7 C)   Ht _0  (1.676 m)   Wt 172 lb (78 kg)   LMP 07/22/2022 (Exact Date)   SpO2 99%   BMI 27.76 kg/m  CONSTITUTIONAL: No acute distress,  well nourished. HEENT:  Normocephalic, atraumatic, extraocular motion intact. NECK: Trachea is midline, and there is no jugular venous distension.  RESPIRATORY:  Lungs are clear, and breath sounds are equal bilaterally. Normal respiratory effort without pathologic use of accessory muscles. CARDIOVASCULAR: Heart is regular without murmurs, gallops, or rubs. GI: The abdomen is soft, non-distended, with tenderness to palpation in the epigastric and RUQ areas.  Negative Murphy's sign.  MUSCULOSKELETAL:  Normal muscle strength and tone in all four extremities.  No peripheral edema or cyanosis. SKIN: Skin turgor is normal. There are no pathologic skin lesions.  NEUROLOGIC:  Motor and sensation is grossly normal.  Cranial nerves are grossly intact. PSYCH:  Alert and oriented to person, place and time. Affect is normal.  Laboratory Analysis: Labs from 07/22/22: Na 139, K 4.3, Cl 102, CO2 29, BUN 14, Cr 0.88.  Total bili 0.7, AST 23, ALT 31, Alk Phos 61, lipase 45, albumin 4.8.  WBC 9.5, Hgb 14.8, Hct 44.5, Plt 263  Imaging: Ultrasound RUQ 07/22/22: IMPRESSION: Gallstone within the gallbladder neck with mild wall thickening. Negative sonographic Murphy's sign is elicited. HIDA scan may be helpful for further evaluation.  Assessment and Plan: This is a 51 y.o. female with symptomatic cholelithiasis.  --Discussed with the patient the findings on her U/S and labs in the ED and how cholelithiasis forms and can result in biliary colic episodes.  She is interested in surgical management. --Discussed with her the plan then for a robotic assisted cholecystectomy.  Reviewed the surgery at length with her including the incisions, the risks of bleeding, infection, injury to surrounding structures, the use of ICG for evaluation of the biliary anatomy, that this would be an outpatient surgery, the post-op activity restrictions, pain control, and she's willing to proceed. --Will schedule her for 08/02/22.  All of  her questions have been answered.  Will send new prescription for pain medication to help in the meantime.  I spent 60 minutes dedicated to the care of this patient on the date of this encounter to include pre-visit review of records, face-to-face time with the patient discussing diagnosis and management, and any post-visit coordination of care.   Melvyn Neth, [facility]

## 2022-07-27 NOTE — Telephone Encounter (Signed)
Patient has been advised of Pre-Admission date/time, and Surgery date.  Surgery Date: 08/02/22 Preadmission Testing Date: 07/29/22 (phone 1p-5p)  Patient has been made aware to call [phone], between 1-3:00pm the day before surgery, to find out what time to arrive for surgery.

## 2022-07-27 NOTE — Patient Instructions (Addendum)
You have requested to have your gallbladder removed. This will be done at [facility] with Dr. Hampton Abbot.  We will send in a prescription for Oxycodone. You may take Tylenol with this. You can take 2 extra strength Tylenol every 6 hours. You can also take some Ibuprofen but it may irritate your stomach.   You will most likely be out of work 1-2 weeks for this surgery.  If you have FMLA or disability paperwork that needs filled out you may drop this off at our office or this can be faxed to [phone].  You will return after your post-op appointment with a lifting restriction for approximately 4 more weeks.  You will be able to eat anything you would like to following surgery. But, start by eating a bland diet and advance this as tolerated. The Gallbladder diet is below, please go as closely by this diet as possible prior to surgery to avoid any further attacks.  Please see the (blue)pre-care form that you have been given today. Our surgery scheduler will call you to verify surgery date and to go over information.   If you have any questions, please call our office.   Laparoscopic Cholecystectomy Laparoscopic cholecystectomy is surgery to remove the gallbladder. The gallbladder is located in the upper right part of the abdomen, behind the liver. It is a storage sac for bile, which is produced in the liver. Bile aids in the digestion and absorption of fats. Cholecystectomy is often done for inflammation of the gallbladder (cholecystitis). This condition is usually caused by a buildup of gallstones (cholelithiasis) in the gallbladder. Gallstones can block the flow of bile, and that can result in inflammation and pain. In severe cases, emergency surgery may be required. If emergency surgery is not required, you will have time to prepare for the procedure. Laparoscopic surgery is an alternative to open surgery. Laparoscopic surgery has a shorter recovery time. Your common bile duct may also need  to be examined during the procedure. If stones are found in the common bile duct, they may be removed. LET [facility] CARE PROVIDER KNOW ABOUT: Any allergies you have. All medicines you are taking, including vitamins, herbs, eye drops, creams, and over-the-counter medicines. Previous problems you or members of your family have had with the use of anesthetics. Any blood disorders you have. Previous surgeries you have had.  Any medical conditions you have. RISKS AND COMPLICATIONS Generally, this is a safe procedure. However, problems may occur, including: Infection. Bleeding. Allergic reactions to medicines. Damage to other structures or organs. A stone remaining in the common bile duct. A bile leak from the cyst duct that is clipped when your gallbladder is removed. The need to convert to open surgery, which requires a larger incision in the abdomen. This may be necessary if your surgeon thinks that it is not safe to continue with a laparoscopic procedure. BEFORE THE PROCEDURE Ask your health care provider about: Changing or stopping your regular medicines. This is especially important if you are taking diabetes medicines or blood thinners. Taking medicines such as aspirin and ibuprofen. These medicines can thin your blood. Do not take these medicines before your procedure if your health care provider instructs you not to. Follow instructions from your health care provider about eating or drinking restrictions. Let your health care provider know if you develop a cold or an infection before surgery. Plan to have someone take you home after the procedure. Ask your health care provider how your surgical site will be  marked or identified. You may be given antibiotic medicine to help prevent infection. PROCEDURE To reduce your risk of infection: Your health care team will wash or sanitize their hands. Your skin will be washed with soap. An IV tube may be inserted into one of your  veins. You will be given a medicine to make you fall asleep (general anesthetic). A breathing tube will be placed in your mouth. The surgeon will make several small cuts (incisions) in your abdomen. A thin, lighted tube (laparoscope) that has a tiny camera on the end will be inserted through one of the small incisions. The camera on the laparoscope will send a picture to a TV screen (monitor) in the operating room. This will give the surgeon a good view inside your abdomen. A gas will be pumped into your abdomen. This will expand your abdomen to give the surgeon more room to perform the surgery. Other tools that are needed for the procedure will be inserted through the other incisions. The gallbladder will be removed through one of the incisions. After your gallbladder has been removed, the incisions will be closed with stitches (sutures), staples, or skin glue. Your incisions may be covered with a bandage (dressing). The procedure may vary among health care providers and hospitals. AFTER THE PROCEDURE Your blood pressure, heart rate, breathing rate, and blood oxygen level will be monitored often until the medicines you were given have worn off. You will be given medicines as needed to control your pain.   This information is not intended to replace advice given to you by your health care provider. Make sure you discuss any questions you have with your health care provider.   Document Released: 12/12/2005 Document Revised: 09/02/2015 Document Reviewed: 07/24/2013 Elsevier Interactive Patient Education 2016 [place] Diet for Gallbladder Conditions A low-fat diet can be helpful if you have pancreatitis or a gallbladder condition. With these conditions, your pancreas and gallbladder have trouble digesting fats. A healthy eating plan with less fat will help rest your pancreas and gallbladder and reduce your symptoms. WHAT DO I NEED TO KNOW ABOUT THIS DIET? Eat a low-fat  diet. Reduce your fat intake to less than 20-30% of your total daily calories. This is less than 50-60 g of fat per day. Remember that you need some fat in your diet. Ask your dietician what your daily goal should be. Choose nonfat and low-fat healthy foods. Look for the words "nonfat," "low fat," or "fat free." As a guide, look on the label and choose foods with less than 3 g of fat per serving. Eat only one serving. Avoid alcohol. Do not smoke. If you need help quitting, talk with your health care provider. Eat small frequent meals instead of three large heavy meals. WHAT FOODS CAN I EAT? Grains Include healthy grains and starches such as potatoes, wheat bread, fiber-rich cereal, and brown rice. Choose whole grain options whenever possible. In adults, whole grains should account for 45-65% of your daily calories.  Fruits and Vegetables Eat plenty of fruits and vegetables. Fresh fruits and vegetables add fiber to your diet. Meats and Other Protein Sources Eat lean meat such as chicken and pork. Trim any fat off of meat before cooking it. Eggs, fish, and beans are other sources of protein. In adults, these foods should account for 10-35% of your daily calories. Dairy Choose low-fat milk and dairy options. Dairy includes fat and protein, as well as calcium.  Fats and Oils Limit high-fat foods such  as fried foods, sweets, baked goods, sugary drinks.  Other Creamy sauces and condiments, such as mayonnaise, can add extra fat. Think about whether or not you need to use them, or use smaller amounts or low fat options. WHAT FOODS ARE NOT RECOMMENDED? High fat foods, such as: [employer]. Ice cream. [place] toast. Sweet rolls. Pizza. Cheese bread. Foods covered with batter, butter, creamy sauces, or cheese. Fried foods. Sugary drinks and desserts. Foods that cause gas or bloating   This information is not intended to replace advice given to you by your health care provider. Make sure you  discuss any questions you have with your health care provider.   Document Released: 12/17/2013 Document Reviewed: 12/17/2013 Elsevier Interactive Patient Education [employer].

## 2022-07-27 NOTE — H&P (View-Only) (Signed)
07/27/2022  Reason for Visit:  Symptomatic cholelithiasis  History of Present Illness: Brenda Miles is a 51 y.o. female presenting for evaluation of symptomatic cholelithiasis.  She presented to the ED on 07/22/22 with epigastric and RUQ pain, associated with nausea and vomiting.  In the ED, she had a GI cocktail which helped with her epigastric pain, but the RUQ pain continued.  She had workup in the ED with normal labs including normal LFTs and WBC and an U/S which showed cholelithiasis, with gallbladder wall thickness of 4 mm.  Her pain improved after a dose of toradol, and she was able to be discharged from the ED with close follow up.    Today, the patient reports that she's still having pain episodes, though not as severe.  She is eating protein shakes to avoid any greasy/fatty foods.  Denies any fevers, but sometimes she feels cold and at others feels hot.  Sometimes she also feels bloated.  The pain radiates towards her back and in the ED it also radiated to her left shoulder and up her chest.  Troponins were negative x 2 and EKG did not show any ischemic changes.  Past Medical History: Past Medical History:  Diagnosis Date   Anxiety    Arthritis    hands   Heartburn    Psoriasis    feet    Skin cancer, basal cell      Past Surgical History: Past Surgical History:  Procedure Laterality Date   ANTERIOR CRUCIATE LIGAMENT REPAIR Left    APPENDECTOMY      Home Medications: Prior to Admission medications   Medication Sig Start Date End Date Taking? Authorizing Provider  acetaminophen (TYLENOL) 500 MG tablet Take 2 tablets (1,000 mg total) by mouth every 6 (six) hours as needed for mild pain. 07/27/22  Yes Heavyn Yearsley, Jacqulyn Bath, MD  DULoxetine (CYMBALTA) 30 MG capsule Take by mouth. 10/18/16  Yes [provider]  ferrous sulfate 325 (65 FE) MG tablet Take by mouth.   Yes [provider]  omeprazole (PRILOSEC) 10 MG capsule Take 10 mg by mouth daily.   Yes [provider]  ondansetron (ZOFRAN-ODT) 4 MG disintegrating tablet Take 1 tablet (4 mg total) by mouth every 8 (eight) hours as needed for nausea or vomiting. 07/23/22  Yes Paulette Blanch, MD  oxyCODONE (ROXICODONE) 5 MG immediate release tablet Take 1-2 tablets (5-10 mg total) by mouth every 6 (six) hours as needed for moderate pain or severe pain. 07/27/22 07/27/23 Yes Julyssa Kyer, Jacqulyn Bath, MD  Probiotic Product (PROBIOTIC ADVANCED PO) Take by mouth.   Yes [provider]  ranitidine (ZANTAC) 300 MG tablet Take 300 mg by mouth at bedtime.   Yes [provider]  SAXENDA 18 MG/3ML SOPN Inject into the skin. Patient not taking: Reported on 07/27/2022 06/23/22   [provider]    Allergies: No Known Allergies  Social History:  reports that she has never smoked. She has never been exposed to tobacco smoke. She has never used smokeless tobacco. She reports current alcohol use. She reports that she does not use drugs.   Family History: Family History  Problem Relation Age of Onset   Hypothyroidism Mother    Diabetes Father    Hypothyroidism Brother    Diabetes Daughter    Prostate cancer Neg Hx    Kidney cancer Neg Hx    Bladder Cancer Neg Hx     Review of Systems: Review of Systems  Constitutional:  Negative for chills and  fever.  HENT:  Negative for hearing loss.   Respiratory:  Negative for shortness of breath.   Cardiovascular:  Negative for chest pain.  Gastrointestinal:  Positive for abdominal pain, nausea and vomiting. Negative for constipation and diarrhea.  Genitourinary:  Negative for dysuria.  Musculoskeletal:  Positive for back pain.  Skin:  Negative for rash.  Neurological:  Negative for dizziness.  Psychiatric/Behavioral:  Negative for depression.     Physical Exam BP 130/66   Pulse 77   Temp 98.1 F (36.7 C)   Ht _0  (1.676 m)   Wt 172 lb (78 kg)   LMP 07/22/2022 (Exact Date)   SpO2 99%   BMI 27.76 kg/m  CONSTITUTIONAL: No acute distress,  well nourished. HEENT:  Normocephalic, atraumatic, extraocular motion intact. NECK: Trachea is midline, and there is no jugular venous distension.  RESPIRATORY:  Lungs are clear, and breath sounds are equal bilaterally. Normal respiratory effort without pathologic use of accessory muscles. CARDIOVASCULAR: Heart is regular without murmurs, gallops, or rubs. GI: The abdomen is soft, non-distended, with tenderness to palpation in the epigastric and RUQ areas.  Negative Murphy's sign.  MUSCULOSKELETAL:  Normal muscle strength and tone in all four extremities.  No peripheral edema or cyanosis. SKIN: Skin turgor is normal. There are no pathologic skin lesions.  NEUROLOGIC:  Motor and sensation is grossly normal.  Cranial nerves are grossly intact. PSYCH:  Alert and oriented to person, place and time. Affect is normal.  Laboratory Analysis: Labs from 07/22/22: Na 139, K 4.3, Cl 102, CO2 29, BUN 14, Cr 0.88.  Total bili 0.7, AST 23, ALT 31, Alk Phos 61, lipase 45, albumin 4.8.  WBC 9.5, Hgb 14.8, Hct 44.5, Plt 263  Imaging: Ultrasound RUQ 07/22/22: IMPRESSION: Gallstone within the gallbladder neck with mild wall thickening. Negative sonographic Murphy's sign is elicited. HIDA scan may be helpful for further evaluation.  Assessment and Plan: This is a 51 y.o. female with symptomatic cholelithiasis.  --Discussed with the patient the findings on her U/S and labs in the ED and how cholelithiasis forms and can result in biliary colic episodes.  She is interested in surgical management. --Discussed with her the plan then for a robotic assisted cholecystectomy.  Reviewed the surgery at length with her including the incisions, the risks of bleeding, infection, injury to surrounding structures, the use of ICG for evaluation of the biliary anatomy, that this would be an outpatient surgery, the post-op activity restrictions, pain control, and she's willing to proceed. --Will schedule her for 08/02/22.  All of  her questions have been answered.  Will send new prescription for pain medication to help in the meantime.  I spent 60 minutes dedicated to the care of this patient on the date of this encounter to include pre-visit review of records, face-to-face time with the patient discussing diagnosis and management, and any post-visit coordination of care.   Melvyn Neth, [facility]

## 2022-07-29 ENCOUNTER — Encounter
Admission: RE | Admit: 2022-07-29 | Discharge: 2022-07-29 | Disposition: A | Discharge status: Home / Self Care | Payer: BC Managed Care – PPO | Source: Ambulatory Visit | Attending: Surgery | Admitting: Surgery

## 2022-07-29 VITALS — Ht 66.0 in | Wt 170.0 lb

## 2022-07-29 DIAGNOSIS — Z01812 Encounter for preprocedural laboratory examination: Secondary | ICD-10-CM

## 2022-07-29 NOTE — Patient Instructions (Addendum)
Your procedure is scheduled on: Tuesday August 02, 2022. Report to Day Surgery inside [facility] 2nd floor, stop by admissions desk before getting on elevator.  To find out your arrival time please call [phone] between 1PM - 3PM on Monday August 01, 2022.  Remember: Instructions that are not followed completely may result in serious medical risk,  up to and including death, or upon the discretion of your surgeon and anesthesiologist your  surgery may need to be rescheduled.     _X__ 1. Do not eat food after midnight the night before your procedure.                 No chewing gum or hard candies. You may drink clear liquids up to 2 hours                 before you are scheduled to arrive for your surgery- DO not drink clear                 liquids within 2 hours of the start of your surgery.                 Clear Liquids include:  water, apple juice without pulp, clear Gatorade, G2 or                  Gatorade Zero (avoid Red/Purple/Blue), Black Coffee or Tea (Do not add                 anything to coffee or tea).  __X__2.  On the morning of surgery brush your teeth with toothpaste and water, you                may rinse your mouth with mouthwash if you wish.  Do not swallow any toothpaste or mouthwash.     _X__ 3.  No Alcohol for 24 hours before or after surgery.   _X__ 4.  Do Not Smoke or use e-cigarettes For 24 Hours Prior to Your Surgery.                 Do not use any chewable tobacco products for at least 6 hours prior to                 Surgery.  _X__  5.  Do not use any recreational drugs (marijuana, cocaine, heroin, ecstasy, MDMA or other)                For at least one week prior to your surgery.  Combination of these drugs with anesthesia                May have life threatening results.  ____  6.  Bring all medications with you on the day of surgery if instructed.   __X__  7.  Notify your doctor if there is any change in your medical condition       (cold, fever, infections).     Do not wear jewelry, make-up, hairpins, clips or nail polish. Do not wear lotions, powders, or perfumes. You may wear deodorant. Do not shave 48 hours prior to surgery.  Do not bring valuables to the hospital.    [facility] is not responsible for any belongings or valuables.  Contacts, dentures or bridgework may not be worn into surgery. Leave your suitcase in the car. After surgery it may be brought to your room. For patients admitted to the hospital, discharge time is determined by your treatment team.  Patients discharged the day of surgery will not be allowed to drive home.   Make arrangements for someone to be with you for the first 24 hours of your Same Day Discharge.   __X__ Take these medicines the morning of surgery with A SIP OF WATER:    1. DULoxetine (CYMBALTA) 30 MG   2. omeprazole (PRILOSEC) 10 MG capsule  3.   4.  5.  6.  ____ Fleet Enema (as directed)   __X__ Use CHG Soap (or Antibacterial Soap) as directed  ____ Use Benzoyl Peroxide Gel as instructed  ____ Use inhalers on the day of surgery  ____ Stop metformin 2 days prior to surgery    ____ Take 1/2 of usual insulin dose the night before surgery. No insulin the morning          of surgery.   ____ Call your PCP, cardiologist, or Pulmonologist if taking Coumadin/Plavix/aspirin and ask when to stop before your surgery.   __X__ One Week prior to surgery- Stop Anti-inflammatories such as Ibuprofen, Aleve, Advil, Motrin, meloxicam (MOBIC), diclofenac, etodolac, ketorolac, Toradol, Daypro, piroxicam, Goody's or BC powders. OK TO USE TYLENOL IF NEEDED   __X__ Stop supplements until after surgery.    ____ Bring C-Pap to the hospital.    If you have any questions regarding your pre-procedure instructions,  Please call Pre-admit Testing at [phone]

## 2022-08-02 ENCOUNTER — Observation Stay
Admission: RE | Admit: 2022-08-02 | Discharge: 2022-08-03 | Disposition: A | Discharge status: Home / Self Care | Payer: BC Managed Care – PPO | Attending: Surgery | Admitting: Surgery

## 2022-08-02 ENCOUNTER — Ambulatory Visit: Payer: BC Managed Care – PPO | Admitting: Certified Registered"

## 2022-08-02 ENCOUNTER — Other Ambulatory Visit: Payer: Self-pay

## 2022-08-02 ENCOUNTER — Encounter
Admission: RE | Disposition: A | Discharge status: Home / Self Care | Payer: Self-pay | Source: Home / Self Care | Attending: Surgery

## 2022-08-02 ENCOUNTER — Encounter: Payer: Self-pay | Admitting: Surgery

## 2022-08-02 DIAGNOSIS — K8000 Calculus of gallbladder with acute cholecystitis without obstruction: Secondary | ICD-10-CM | POA: Diagnosis not present

## 2022-08-02 DIAGNOSIS — K810 Acute cholecystitis: Secondary | ICD-10-CM | POA: Diagnosis present

## 2022-08-02 DIAGNOSIS — K8012 Calculus of gallbladder with acute and chronic cholecystitis without obstruction: Secondary | ICD-10-CM | POA: Diagnosis not present

## 2022-08-02 DIAGNOSIS — Z01812 Encounter for preprocedural laboratory examination: Secondary | ICD-10-CM

## 2022-08-02 DIAGNOSIS — Z85828 Personal history of other malignant neoplasm of skin: Secondary | ICD-10-CM | POA: Insufficient documentation

## 2022-08-02 DIAGNOSIS — Z79899 Other long term (current) drug therapy: Secondary | ICD-10-CM | POA: Insufficient documentation

## 2022-08-02 DIAGNOSIS — K8020 Calculus of gallbladder without cholecystitis without obstruction: Secondary | ICD-10-CM | POA: Diagnosis present

## 2022-08-02 LAB — POCT PREGNANCY, URINE: Preg Test, Ur: NEGATIVE

## 2022-08-02 SURGERY — CHOLECYSTECTOMY, ROBOT-ASSISTED, LAPAROSCOPIC
Anesthesia: General | Site: Abdomen

## 2022-08-02 MED ORDER — ACETAMINOPHEN 500 MG PO TABS: 1000.0000 mg | ORAL_TABLET | ORAL | Status: AC

## 2022-08-02 MED ORDER — DULOXETINE HCL 30 MG PO CPEP
30.0000 mg | ORAL_CAPSULE | Freq: Every day | ORAL | Status: DC
  Administered 2022-08-03: 30 mg via ORAL
  Filled 2022-08-02: qty 1

## 2022-08-02 MED ORDER — OXYCODONE HCL 5 MG PO TABS
5.0000 mg | ORAL_TABLET | ORAL | Status: DC | PRN
  Administered 2022-08-02: 10 mg via ORAL
  Administered 2022-08-03: 5 mg via ORAL
  Administered 2022-08-03 (×2): 10 mg via ORAL
  Filled 2022-08-02 (×3): qty 2
  Filled 2022-08-02: qty 1
  Filled 2022-08-02: qty 2

## 2022-08-02 MED ORDER — BUPIVACAINE-EPINEPHRINE (PF) 0.25% -1:200000 IJ SOLN
INTRAMUSCULAR | Status: AC
  Filled 2022-08-02: qty 30

## 2022-08-02 MED ORDER — ACETAMINOPHEN 10 MG/ML IV SOLN
INTRAVENOUS | Status: AC
  Filled 2022-08-02: qty 100

## 2022-08-02 MED ORDER — LACTATED RINGERS IV SOLN: INTRAVENOUS | Status: DC

## 2022-08-02 MED ORDER — SIMETHICONE 80 MG PO CHEW
160.0000 mg | CHEWABLE_TABLET | Freq: Three times a day (TID) | ORAL | Status: DC | PRN
Start: 2022-08-02 — End: 2022-08-03
  Administered 2022-08-02 – 2022-08-03 (×2): 160 mg via ORAL
  Filled 2022-08-02 (×4): qty 2

## 2022-08-02 MED ORDER — GABAPENTIN 300 MG PO CAPS: 300.0000 mg | ORAL_CAPSULE | ORAL | Status: AC

## 2022-08-02 MED ORDER — ROCURONIUM BROMIDE 100 MG/10ML IV SOLN
INTRAVENOUS | Status: DC | PRN
  Administered 2022-08-02 (×2): 50 mg via INTRAVENOUS
  Administered 2022-08-02: 30 mg via INTRAVENOUS
  Administered 2022-08-02: 20 mg via INTRAVENOUS

## 2022-08-02 MED ORDER — FENTANYL CITRATE (PF) 100 MCG/2ML IJ SOLN
INTRAMUSCULAR | Status: AC
  Filled 2022-08-02: qty 2

## 2022-08-02 MED ORDER — 0.9 % SODIUM CHLORIDE (POUR BTL) OPTIME
TOPICAL | Status: DC | PRN
  Administered 2022-08-02: 500 mL

## 2022-08-02 MED ORDER — CEFAZOLIN SODIUM-DEXTROSE 2-4 GM/100ML-% IV SOLN
2.0000 g | INTRAVENOUS | Status: AC
  Administered 2022-08-02: 2 g via INTRAVENOUS

## 2022-08-02 MED ORDER — FENTANYL CITRATE (PF) 100 MCG/2ML IJ SOLN
INTRAMUSCULAR | Status: AC
  Administered 2022-08-02: 50 ug via INTRAVENOUS
  Filled 2022-08-02: qty 2

## 2022-08-02 MED ORDER — PROMETHAZINE HCL 25 MG/ML IJ SOLN: 6.2500 mg | INTRAMUSCULAR | Status: DC | PRN

## 2022-08-02 MED ORDER — LIDOCAINE HCL (CARDIAC) PF 100 MG/5ML IV SOSY
PREFILLED_SYRINGE | INTRAVENOUS | Status: DC | PRN
  Administered 2022-08-02: 60 mg via INTRAVENOUS

## 2022-08-02 MED ORDER — ONDANSETRON 4 MG PO TBDP: 4.0000 mg | ORAL_TABLET | Freq: Four times a day (QID) | ORAL | Status: DC | PRN

## 2022-08-02 MED ORDER — ACETAMINOPHEN 10 MG/ML IV SOLN
1000.0000 mg | Freq: Once | INTRAVENOUS | Status: DC | PRN
  Administered 2022-08-02: 1000 mg via INTRAVENOUS

## 2022-08-02 MED ORDER — CEFAZOLIN SODIUM-DEXTROSE 2-4 GM/100ML-% IV SOLN
INTRAVENOUS | Status: AC
  Filled 2022-08-02: qty 100

## 2022-08-02 MED ORDER — HYDROMORPHONE HCL 1 MG/ML IJ SOLN
0.5000 mg | INTRAMUSCULAR | Status: DC | PRN
  Administered 2022-08-03: 0.5 mg via INTRAVENOUS
  Filled 2022-08-02: qty 1

## 2022-08-02 MED ORDER — ACETAMINOPHEN 500 MG PO TABS
ORAL_TABLET | ORAL | Status: AC
  Administered 2022-08-02: 1000 mg via ORAL
  Filled 2022-08-02: qty 2

## 2022-08-02 MED ORDER — PANTOPRAZOLE SODIUM 40 MG IV SOLR
40.0000 mg | Freq: Every day | INTRAVENOUS | Status: DC
  Administered 2022-08-02: 40 mg via INTRAVENOUS
  Filled 2022-08-02: qty 10

## 2022-08-02 MED ORDER — SUGAMMADEX SODIUM 200 MG/2ML IV SOLN
INTRAVENOUS | Status: DC | PRN
  Administered 2022-08-02: 160 mg via INTRAVENOUS

## 2022-08-02 MED ORDER — FENTANYL CITRATE (PF) 100 MCG/2ML IJ SOLN
INTRAMUSCULAR | Status: DC | PRN
Start: 2022-08-02 — End: 2022-08-02
  Administered 2022-08-02 (×4): 50 ug via INTRAVENOUS

## 2022-08-02 MED ORDER — BUPIVACAINE LIPOSOME 1.3 % IJ SUSP: 20.0000 mL | Freq: Once | INTRAMUSCULAR | Status: DC

## 2022-08-02 MED ORDER — SODIUM CHLORIDE 0.9 % IV SOLN
INTRAVENOUS | Status: AC | PRN
  Administered 2022-08-02: 1000 mL

## 2022-08-02 MED ORDER — FENTANYL CITRATE (PF) 100 MCG/2ML IJ SOLN
25.0000 ug | INTRAMUSCULAR | Status: DC | PRN
  Administered 2022-08-02 (×2): 50 ug via INTRAVENOUS

## 2022-08-02 MED ORDER — OXYCODONE HCL 5 MG PO TABS: 5.0000 mg | ORAL_TABLET | Freq: Once | ORAL | Status: DC | PRN

## 2022-08-02 MED ORDER — DEXAMETHASONE SODIUM PHOSPHATE 10 MG/ML IJ SOLN
INTRAMUSCULAR | Status: DC | PRN
  Administered 2022-08-02: 8 mg via INTRAVENOUS

## 2022-08-02 MED ORDER — CHLORHEXIDINE GLUCONATE 0.12 % MT SOLN
OROMUCOSAL | Status: AC
  Administered 2022-08-02: 15 mL via OROMUCOSAL
  Filled 2022-08-02: qty 15

## 2022-08-02 MED ORDER — CHLORHEXIDINE GLUCONATE CLOTH 2 % EX PADS
6.0000 | MEDICATED_PAD | Freq: Once | CUTANEOUS | Status: AC
  Administered 2022-08-02: 6 via TOPICAL

## 2022-08-02 MED ORDER — CHLORHEXIDINE GLUCONATE CLOTH 2 % EX PADS: 6.0000 | MEDICATED_PAD | Freq: Once | CUTANEOUS | Status: DC

## 2022-08-02 MED ORDER — GABAPENTIN 300 MG PO CAPS
ORAL_CAPSULE | ORAL | Status: AC
  Administered 2022-08-02: 300 mg via ORAL
  Filled 2022-08-02: qty 1

## 2022-08-02 MED ORDER — FERROUS SULFATE 325 (65 FE) MG PO TABS
325.0000 mg | ORAL_TABLET | Freq: Every day | ORAL | Status: DC
  Administered 2022-08-03: 325 mg via ORAL
  Filled 2022-08-02: qty 1

## 2022-08-02 MED ORDER — DROPERIDOL 2.5 MG/ML IJ SOLN: 0.6250 mg | Freq: Once | INTRAMUSCULAR | Status: DC | PRN

## 2022-08-02 MED ORDER — PROPOFOL 10 MG/ML IV BOLUS
INTRAVENOUS | Status: DC | PRN
  Administered 2022-08-02: 150 mg via INTRAVENOUS

## 2022-08-02 MED ORDER — CHLORHEXIDINE GLUCONATE 0.12 % MT SOLN: 15.0000 mL | Freq: Once | OROMUCOSAL | Status: AC

## 2022-08-02 MED ORDER — CEFOTETAN DISODIUM 2 G IJ SOLR
2.0000 g | Freq: Three times a day (TID) | INTRAMUSCULAR | Status: DC
  Administered 2022-08-02 – 2022-08-03 (×3): 2 g via INTRAVENOUS
  Filled 2022-08-02 (×4): qty 2

## 2022-08-02 MED ORDER — HEMOSTATIC AGENTS (NO CHARGE) OPTIME
TOPICAL | Status: DC | PRN
  Administered 2022-08-02: 1 via TOPICAL

## 2022-08-02 MED ORDER — ACETAMINOPHEN 500 MG PO TABS
1000.0000 mg | ORAL_TABLET | Freq: Four times a day (QID) | ORAL | Status: DC
  Administered 2022-08-03 (×3): 1000 mg via ORAL
  Filled 2022-08-02 (×3): qty 2

## 2022-08-02 MED ORDER — KETOROLAC TROMETHAMINE 30 MG/ML IJ SOLN
INTRAMUSCULAR | Status: AC
  Administered 2022-08-02: 30 mg
  Filled 2022-08-02: qty 1

## 2022-08-02 MED ORDER — ONDANSETRON HCL 4 MG/2ML IJ SOLN: 4.0000 mg | Freq: Four times a day (QID) | INTRAMUSCULAR | Status: DC | PRN

## 2022-08-02 MED ORDER — KETOROLAC TROMETHAMINE 30 MG/ML IJ SOLN
30.0000 mg | Freq: Four times a day (QID) | INTRAMUSCULAR | Status: DC
  Administered 2022-08-03 (×2): 30 mg via INTRAVENOUS
  Filled 2022-08-02 (×2): qty 1

## 2022-08-02 MED ORDER — LACTATED RINGERS IV SOLN: INTRAVENOUS | Status: DC | PRN

## 2022-08-02 MED ORDER — MIDAZOLAM HCL 2 MG/2ML IJ SOLN
INTRAMUSCULAR | Status: AC
  Filled 2022-08-02: qty 2

## 2022-08-02 MED ORDER — BUPIVACAINE-EPINEPHRINE 0.25% -1:200000 IJ SOLN
INTRAMUSCULAR | Status: DC | PRN
  Administered 2022-08-02: 40 mL

## 2022-08-02 MED ORDER — INDOCYANINE GREEN 25 MG IV SOLR
2.5000 mg | INTRAVENOUS | Status: AC
  Administered 2022-08-02: 2.5 mg via INTRAVENOUS
  Filled 2022-08-02: qty 1

## 2022-08-02 MED ORDER — ORAL CARE MOUTH RINSE: 15.0000 mL | Freq: Once | OROMUCOSAL | Status: AC

## 2022-08-02 MED ORDER — OXYCODONE HCL 5 MG/5ML PO SOLN: 5.0000 mg | Freq: Once | ORAL | Status: DC | PRN

## 2022-08-02 MED ORDER — GLYCOPYRROLATE 0.2 MG/ML IJ SOLN
INTRAMUSCULAR | Status: DC | PRN
  Administered 2022-08-02: .2 mg via INTRAVENOUS

## 2022-08-02 MED ORDER — POLYETHYLENE GLYCOL 3350 17 G PO PACK: 17.0000 g | PACK | Freq: Every day | ORAL | Status: DC | PRN

## 2022-08-02 MED ORDER — EPHEDRINE SULFATE (PRESSORS) 50 MG/ML IJ SOLN
INTRAMUSCULAR | Status: DC | PRN
  Administered 2022-08-02: 10 mg via INTRAVENOUS

## 2022-08-02 MED ORDER — BUPIVACAINE LIPOSOME 1.3 % IJ SUSP
INTRAMUSCULAR | Status: AC
  Filled 2022-08-02: qty 10

## 2022-08-02 MED ORDER — ONDANSETRON HCL 4 MG/2ML IJ SOLN
INTRAMUSCULAR | Status: DC | PRN
  Administered 2022-08-02: 4 mg via INTRAVENOUS

## 2022-08-02 MED ORDER — MIDAZOLAM HCL 2 MG/2ML IJ SOLN
INTRAMUSCULAR | Status: DC | PRN
  Administered 2022-08-02: 2 mg via INTRAVENOUS

## 2022-08-02 SURGICAL SUPPLY — 63 items
APPLICATOR ARISTA FLEXITIP XL (MISCELLANEOUS) ×1 IMPLANT
BAG PRESSURE INF DISP 1000 (BAG) ×1 IMPLANT
BAG PRESSURE INF REUSE 1000 (BAG) IMPLANT
BULB RESERV EVAC DRAIN JP 100C (MISCELLANEOUS) ×1 IMPLANT
CANNULA REDUC XI 12-8 STAPL (CANNULA) ×2
CANNULA REDUCER 12-8 DVNC XI (CANNULA) ×1 IMPLANT
CLIP LIGATING HEMO O LOK GREEN (MISCELLANEOUS) ×2 IMPLANT
CUP MEDICINE 2OZ PLAST GRAD ST (MISCELLANEOUS) ×2 IMPLANT
DERMABOND ADVANCED (GAUZE/BANDAGES/DRESSINGS) ×2
DERMABOND ADVANCED .7 DNX12 (GAUZE/BANDAGES/DRESSINGS) ×1 IMPLANT
DRAIN CHANNEL JP 19F (MISCELLANEOUS) ×1 IMPLANT
DRAPE ARM DVNC X/XI (DISPOSABLE) ×4 IMPLANT
DRAPE COLUMN DVNC XI (DISPOSABLE) ×1 IMPLANT
DRAPE DA VINCI XI ARM (DISPOSABLE) ×8
DRAPE DA VINCI XI COLUMN (DISPOSABLE) ×2
ELECT CAUTERY BLADE TIP 2.5 (TIP) ×2
ELECT REM PT RETURN 9FT ADLT (ELECTROSURGICAL) ×2
ELECTRODE CAUTERY BLDE TIP 2.5 (TIP) ×1 IMPLANT
ELECTRODE REM PT RTRN 9FT ADLT (ELECTROSURGICAL) ×1 IMPLANT
GAUZE SPONGE 4X4 12PLY STRL (GAUZE/BANDAGES/DRESSINGS) ×1 IMPLANT
GLOVE SURG SYN 7.0 (GLOVE) ×4 IMPLANT
GLOVE SURG SYN 7.0 PF PI (GLOVE) ×2 IMPLANT
GLOVE SURG SYN 7.5  E (GLOVE) ×4
GLOVE SURG SYN 7.5 E (GLOVE) ×2 IMPLANT
GLOVE SURG SYN 7.5 PF PI (GLOVE) ×2 IMPLANT
GOWN STRL REUS W/ TWL LRG LVL3 (GOWN DISPOSABLE) ×4 IMPLANT
GOWN STRL REUS W/TWL LRG LVL3 (GOWN DISPOSABLE) ×8
HEMOSTAT ARISTA ABSORB 3G PWDR (HEMOSTASIS) ×1 IMPLANT
IRRIGATOR SUCT 8 DISP DVNC XI (IRRIGATION / IRRIGATOR) IMPLANT
IRRIGATOR SUCTION 8MM XI DISP (IRRIGATION / IRRIGATOR) ×2
IV NS 1000ML (IV SOLUTION) ×2
IV NS 1000ML BAXH (IV SOLUTION) IMPLANT
KIT PINK PAD W/HEAD ARE REST (MISCELLANEOUS) ×2
KIT PINK PAD W/HEAD ARM REST (MISCELLANEOUS) ×1 IMPLANT
LABEL OR SOLS (LABEL) ×2 IMPLANT
MANIFOLD NEPTUNE II (INSTRUMENTS) ×2 IMPLANT
NEEDLE HYPO 22GX1.5 SAFETY (NEEDLE) ×2 IMPLANT
NS IRRIG 500ML POUR BTL (IV SOLUTION) ×2 IMPLANT
OBTURATOR OPTICAL STANDARD 8MM (TROCAR) ×2
OBTURATOR OPTICAL STND 8 DVNC (TROCAR) ×1
OBTURATOR OPTICALSTD 8 DVNC (TROCAR) ×1 IMPLANT
PACK LAP CHOLECYSTECTOMY (MISCELLANEOUS) ×2 IMPLANT
SEAL CANN UNIV 5-8 DVNC XI (MISCELLANEOUS) ×3 IMPLANT
SEAL XI 5MM-8MM UNIVERSAL (MISCELLANEOUS) ×6
SET TUBE SMOKE EVAC HIGH FLOW (TUBING) ×2 IMPLANT
SOLUTION ELECTROLUBE (MISCELLANEOUS) ×2 IMPLANT
SPIKE FLUID TRANSFER (MISCELLANEOUS) ×2 IMPLANT
SPONGE T-LAP 18X18 ~~LOC~~+RFID (SPONGE) IMPLANT
SPONGE T-LAP 4X18 ~~LOC~~+RFID (SPONGE) ×2 IMPLANT
STAPLER CANNULA SEAL DVNC XI (STAPLE) ×1 IMPLANT
STAPLER CANNULA SEAL XI (STAPLE) ×2
SUT ETHILON 3-0 FS-10 30 BLK (SUTURE) ×2
SUT MNCRL AB 4-0 PS2 18 (SUTURE) ×2 IMPLANT
SUT SILK 3-0 (SUTURE) IMPLANT
SUT VIC AB 3-0 SH 27 (SUTURE) ×2
SUT VIC AB 3-0 SH 27X BRD (SUTURE) IMPLANT
SUT VICRYL 0 AB UR-6 (SUTURE) ×4 IMPLANT
SUT VLOC 90 S/L VL9 GS22 (SUTURE) ×1 IMPLANT
SUTURE EHLN 3-0 FS-10 30 BLK (SUTURE) IMPLANT
SYS BAG RETRIEVAL 10MM (BASKET) ×2
SYSTEM BAG RETRIEVAL 10MM (BASKET) ×1 IMPLANT
TAPE TRANSPORE STRL 2 31045 (GAUZE/BANDAGES/DRESSINGS) ×2 IMPLANT
WATER STERILE IRR 500ML POUR (IV SOLUTION) ×2 IMPLANT

## 2022-08-02 NOTE — Anesthesia Preprocedure Evaluation (Signed)
Anesthesia Evaluation  Patient identified by MRN, date of birth, ID band Patient awake    Reviewed: Allergy & Precautions, NPO status , Patient's Chart, lab work & pertinent test results  Airway Mallampati: III  TM Distance: >3 FB Neck ROM: full    Dental no notable dental hx.    Pulmonary neg pulmonary ROS,    Pulmonary exam normal        Cardiovascular negative cardio ROS Normal cardiovascular exam     Neuro/Psych negative neurological ROS  negative psych ROS   GI/Hepatic Neg liver ROS, GERD  Medicated and Controlled,  Endo/Other  negative endocrine ROS  Renal/GU      Musculoskeletal   Abdominal Normal abdominal exam  (+)   Peds  Hematology negative hematology ROS (+)   Anesthesia Other Findings symptomatic cholelithiasis  Past Medical History: No date: Anxiety No date: Arthritis     Comment:  hands No date: Heartburn No date: Psoriasis     Comment:  feet  No date: Skin cancer, basal cell  Past Surgical History: No date: ANTERIOR CRUCIATE LIGAMENT REPAIR; Left 2013: APPENDECTOMY  BMI    Body Mass Index: 27.44 kg/m      Reproductive/Obstetrics negative OB ROS                             Anesthesia Physical Anesthesia Plan  ASA: 2  Anesthesia Plan: General ETT   Post-op Pain Management: Tylenol PO (pre-op)*, Gabapentin PO (pre-op)* and Toradol IV (intra-op)*   Induction: Intravenous  PONV Risk Score and Plan: 4 or greater and Ondansetron, Dexamethasone, Midazolam and Promethazine  Airway Management Planned: Oral ETT  Additional Equipment:   Intra-op Plan:   Post-operative Plan: Extubation in OR  Informed Consent: I have reviewed the patients History and Physical, chart, labs and discussed the procedure including the risks, benefits and alternatives for the proposed anesthesia with the patient or authorized representative who has indicated his/her understanding  and acceptance.     Dental Advisory Given  Plan Discussed with: Anesthesiologist, CRNA and Surgeon  Anesthesia Plan Comments:         Anesthesia Quick Evaluation

## 2022-08-02 NOTE — Interval H&P Note (Signed)
History and Physical Interval Note:  08/02/2022 3:17 PM  Brenda Miles  has presented today for surgery, with the diagnosis of symptomatic cholelithiasis.  The various methods of treatment have been discussed with the patient and family. After consideration of risks, benefits and other options for treatment, the patient has consented to  Procedure(s): XI ROBOTIC ASSISTED LAPAROSCOPIC CHOLECYSTECTOMY (N/A) [facility] (ICG) (N/A) as a surgical intervention.  The patient's history has been reviewed, patient examined, no change in status, stable for surgery.  I have reviewed the patient's chart and labs.  Questions were answered to the patient's satisfaction.     Darold Miley

## 2022-08-02 NOTE — Anesthesia Procedure Notes (Signed)
Procedure Name: Intubation Date/Time: 08/02/2022 3:43 PM  Performed by: Beverely Low, CRNAPre-anesthesia Checklist: Patient identified, Patient being monitored, Timeout performed, Emergency Drugs available and Suction available Patient Re-evaluated:Patient Re-evaluated prior to induction Oxygen Delivery Method: Circle system utilized Preoxygenation: Pre-oxygenation with 100% oxygen Induction Type: IV induction Ventilation: Mask ventilation without difficulty Laryngoscope Size: 3 and McGraph Grade View: Grade I Tube type: Oral Tube size: 6.5 mm Number of attempts: 1 Airway Equipment and Method: Stylet Placement Confirmation: ETT inserted through vocal cords under direct vision, positive ETCO2 and breath sounds checked- equal and bilateral Secured at: 21 cm Tube secured with: Tape Dental Injury: Teeth and Oropharynx as per pre-operative assessment

## 2022-08-02 NOTE — Transfer of Care (Addendum)
Immediate Anesthesia Transfer of Care Note  Patient: Brenda Miles  Procedure(s) Performed: XI ROBOTIC ASSISTED LAPAROSCOPIC CHOLECYSTECTOMY INDOCYANINE GREEN FLUORESCENCE IMAGING (ICG)  Patient Location: PACU   Anesthesia Type:General  Level of Consciousness: drowsy  Airway & Oxygen Therapy: Patient Spontanous Breathing and Patient connected to face mask oxygen  Post-op Assessment: Report given to RN and Post -op Vital signs reviewed and stable  Post vital signs: Reviewed and stable  Last Vitals:  Vitals Value Taken Time  BP    Temp    Pulse    Resp    SpO2      Last Pain:  Vitals:   08/02/22 1235  TempSrc: Temporal  PainSc: 1          Complications: No notable events documented.

## 2022-08-02 NOTE — Plan of Care (Signed)

## 2022-08-02 NOTE — Op Note (Signed)
Procedure Date:  08/02/2022  Pre-operative Diagnosis:  Symptomatic cholelithiasis  Post-operative Diagnosis: Acute cholecystitis  Procedure:  Robotic assisted cholecystectomy with ICG FireFly cholangiogram  Surgeon:  Melvyn Neth, MD  Anesthesia:  General endotracheal  Estimated Blood Loss:  100 ml  Specimens:  gallbladder  Complications:  None apparent.  Indications for Procedure:  This is a 51 y.o. female who presents with abdominal pain and workup revealing symptomatic cholelithiasis.  The benefits, complications, treatment options, and expected outcomes were discussed with the patient. The risks of bleeding, infection, recurrence of symptoms, failure to resolve symptoms, bile duct damage, bile duct leak, retained common bile duct stone, bowel injury, and need for further procedures were all discussed with the patient and she was willing to proceed.  Description of Procedure: The patient was correctly identified in the preoperative area and brought into the operating room.  The patient was placed supine with VTE prophylaxis in place.  Appropriate time-outs were performed.  Anesthesia was induced and the patient was intubated.  Appropriate antibiotics were infused.  The abdomen was prepped and draped in a sterile fashion. An infraumbilical incision was made. A cutdown technique was used to enter the abdominal cavity without injury, and a 12 mm robotic port was inserted.  Pneumoperitoneum was obtained with appropriate opening pressures.  Three 8-mm ports were placed in the mid abdomen at the level of the umbilicus under direct visualization.  The DaVinci platform was docked, camera targeted, and instruments were placed under direct visualization.  The gallbladder was identified but there were very dense/thick adhesions from the omentum to the gallbladder.  We had to do extensive lysis of adhesions in order to be able to free the gallbladder, which took over 60 minutes.  The gallbladder  was also very distended and inflamed, more consistent with acute cholecystitis.  It was difficult to grasp, and laparoscopic needle decompression was not successful after two attempts.  The fundus was retracted cephalad.  Adhesions were lysed bluntly and with electrocautery. The infundibulum was grasped and retracted laterally, exposing the peritoneum overlying the gallbladder.  Once all the omentum was freed up, I was able to dissect down to the neck of the gallbladder.  FireFly cholangiogram was then obtained, and it was very difficult at first to find the cystic duct as it was obstructed.  I was able to find the common bile duct and it a very short stump of the take-off to the cystic duct.  Based on that, I was able to identify the cystic duct coming off the neck of the gallbladder.  After that, I was able to find the cystic duct.  The cystic duct and cystic artery were carefully dissected with combination of cautery and blunt dissection.  Both were clipped twice proximally and once distally, cutting in between.  The gallbladder was taken from the gallbladder fossa in a retrograde fashion with electrocautery. The gallbladder was placed in an Endocatch bag. The liver bed was inspected and any bleeding was controlled with electrocautery. The right upper quadrant was then inspected again revealing intact clips, no bleeding, and no ductal injury.  The area was thoroughly irrigated.  There was an area that was noted from the initial dissection which I could not distinguish if it was just scar tissue or bowel wall/serosa.  We performed an OG insufflation test with 300 ml of air, and there was good distention of the stomach, but no issues in that area.  As a precaution, I oversowed it with 2-0 V-lock suture  and again there was no other area of concern.  3 gm of Arista powder was sprayed over the RUQ area and gallbladder fossa.  A 19 Fr. Blake drain was inserted through the right lateral port and placed in the RUQ and  fossa.  The 8 mm ports were removed under direct visualization and the 12 mm port was removed.  The Endocatch bag was brought out via the umbilical incision. The fascial opening was closed using 0 vicryl sutures.  Local anesthetic was infused in all incisions and the incisions were closed with 3-0 Vicryl and 4-0 Monocryl.  The drain was secured using 3-0 Nylon.  The wounds were cleaned and sealed with DermaBond.  The drain was dressed with 4x4 gauze and TegaDerm.  The patient was emerged from anesthesia and extubated and brought to the recovery room for further management.  The patient tolerated the procedure well and all counts were correct at the end of the case.   Melvyn Neth, MD

## 2022-08-03 DIAGNOSIS — K8012 Calculus of gallbladder with acute and chronic cholecystitis without obstruction: Secondary | ICD-10-CM | POA: Diagnosis not present

## 2022-08-03 LAB — COMPREHENSIVE METABOLIC PANEL
ALT: 33 U/L (ref 0–44)
AST: 35 U/L (ref 15–41)
Albumin: 3.1 g/dL — ABNORMAL LOW (ref 3.5–5.0)
Alkaline Phosphatase: 54 U/L (ref 38–126)
Anion gap: 7 (ref 5–15)
BUN: 11 mg/dL (ref 6–20)
CO2: 28 mmol/L (ref 22–32)
Calcium: 8.5 mg/dL — ABNORMAL LOW (ref 8.9–10.3)
Chloride: 102 mmol/L (ref 98–111)
Creatinine, Ser: 0.87 mg/dL (ref 0.44–1.00)
GFR, Estimated: >60 mL/min (ref >60)
Glucose, Bld: 126 mg/dL — ABNORMAL HIGH (ref 70–99)
Potassium: 4.3 mmol/L (ref 3.5–5.1)
Sodium: 137 mmol/L (ref 135–145)
Total Bilirubin: 0.4 mg/dL (ref 0.3–1.2)
Total Protein: 5.6 g/dL — ABNORMAL LOW (ref 6.5–8.1)

## 2022-08-03 LAB — CBC
HCT: 37.6 % (ref 36.0–46.0)
Hemoglobin: 12.3 g/dL (ref 12.0–15.0)
MCH: 29.4 pg (ref 26.0–34.0)
MCHC: 32.7 g/dL (ref 30.0–36.0)
MCV: 90 fL (ref 80.0–100.0)
Platelets: 337 10*3/uL (ref 150–400)
RBC: 4.18 MIL/uL (ref 3.87–5.11)
RDW: 12.4 % (ref 11.5–15.5)
WBC: 10.5 10*3/uL (ref 4.0–10.5)
nRBC: 0 % (ref 0.0–0.2)

## 2022-08-03 LAB — HIV ANTIBODY (ROUTINE TESTING W REFLEX): HIV Screen 4th Generation wRfx: NONREACTIVE

## 2022-08-03 MED ORDER — IBUPROFEN 600 MG PO TABS: 600.0000 mg | ORAL_TABLET | Freq: Four times a day (QID) | ORAL | 0 refills | Status: AC | PRN

## 2022-08-03 MED ORDER — AMOXICILLIN-POT CLAVULANATE 875-125 MG PO TABS: 1.0000 | ORAL_TABLET | Freq: Two times a day (BID) | ORAL | 0 refills | Status: AC

## 2022-08-03 MED ORDER — OXYCODONE HCL 5 MG PO TABS: 5.0000 mg | ORAL_TABLET | Freq: Four times a day (QID) | ORAL | 0 refills | Status: DC | PRN

## 2022-08-03 NOTE — Progress Notes (Addendum)
Reviewed discharge instructions with pt, husband at bedside. Educated on JP drainage care. Pt verbalized understanding. IV intact upon removal.Staff wheeled pt out. Pt transported to home via family vehicle.

## 2022-08-03 NOTE — Discharge Summary (Signed)
[facility] SURGICAL DISCHARGE SUMMARY  Patient ID: Brenda Miles MRN: [id number] DOB/AGE: 1971/06/10 51 y.o.  Admit date: 08/02/2022 Discharge date: 08/03/2022  Discharge Diagnoses Patient Active Problem List   Diagnosis Date Noted   Acute cholecystitis 08/02/2022    Consultants None  Procedures 08/02/2022:  Robotic assisted cholecystectomy with ICG FireFly cholangiogram  HPI: Brenda Miles is a 51 y.o. female with a history of symptomatic cholelithiasis who presents to [facility] on 08[facility]08 for scheduled cholecystectomy  Hospital Course: Informed consent was obtained and documented, and patient underwent robotic assisted laparoscopic cholecystectomy (Dr Hampton Abbot, 08/02/2022).  There was a concern for potential bowel injury intra-operatively, but this was ruled out with insufflation testing via NGT. This was oversowed as a precaution. Post-operatively, patient did well.  Advancement of patient's diet and ambulation were well-tolerated. The remainder of patient's hospital course was essentially unremarkable, and discharge planning was initiated accordingly with patient safely able to be discharged home with appropriate discharge instructions, antibiotics (Augmentin x7 days), pain control, and outpatient follow-up after all of her questions were answered to her expressed satisfaction.   Discharge Condition: Good   Physical Examination:  Constitutional: Well appearing female, NAD Pulmonary: Normal effort, no respiratory distress Gastrointestinal: Soft, incisional soreness, non-distended, no rebound/guarding. Surgical drain in right lateral port site; serosanguinous  Skin: Laparoscopic incisions are CDI with dermabond, no erythema    Allergies as of 08/03/2022   No Known Allergies      Medication List     TAKE these medications    acetaminophen 500 MG tablet Commonly known as: TYLENOL Take 2 tablets (1,000 mg total) by mouth every 6 (six) hours as needed for mild pain.    amoxicillin-clavulanate 875-125 MG tablet Commonly known as: AUGMENTIN Take 1 tablet by mouth 2 (two) times daily for 7 days.   b complex vitamins capsule Take 1 capsule by mouth daily.   DULoxetine 30 MG capsule Commonly known as: CYMBALTA Take 30 mg by mouth daily.   ferrous sulfate 325 (65 FE) MG tablet Take 325 mg by mouth daily with breakfast.   ibuprofen 600 MG tablet Commonly known as: ADVIL Take 1 tablet (600 mg total) by mouth every 6 (six) hours as needed.   omeprazole 10 MG capsule Commonly known as: PRILOSEC Take 10 mg by mouth daily.   ondansetron 4 MG disintegrating tablet Commonly known as: ZOFRAN-ODT Take 1 tablet (4 mg total) by mouth every 8 (eight) hours as needed for nausea or vomiting.   oxyCODONE 5 MG immediate release tablet Commonly known as: Roxicodone Take 1 tablet (5 mg total) by mouth every 6 (six) hours as needed for severe pain or breakthrough pain. What changed:  how much to take reasons to take this   PROBIOTIC ADVANCED PO Take by mouth.   ranitidine 300 MG tablet Commonly known as: ZANTAC Take 300 mg by mouth at bedtime.   Saxenda 18 MG/3ML Sopn Generic drug: Liraglutide -Weight Management Inject into the skin.   VITAMIN D PO Take by mouth.          Follow-up Information     Olean Ree, MD. Schedule an appointment as soon as possible for a visit on 08/08/2022.   Specialty: General Surgery Why: schedule appointment in [place] on 08/08/22 for drain removal Contact information: [address] [place] [address] [phone]                  Time spent on discharge management including discussion of hospital course, clinical condition, outpatient  instructions, prescriptions, and follow up with the patient and members of the medical team: >30 minutes  -- Edison Simon , PA-C [facility]  08/03/2022, 8:48 AM [phone] M-F: 7am - 4pm

## 2022-08-03 NOTE — Discharge Instructions (Signed)
In addition to included general post-operative instructions,  Diet: Resume home diet. Recommend avoiding or limiting fatty/greasy foods over the next few days/week. If you do eat these, you may (or may not) notice diarrhea. This is expected while your body adjusts to not having a gallbladder, and it typically resolves with time.   Activity: No heavy lifting >20 pounds (children, pets, laundry, garbage) or strenuous activity for 4 weeks, but light activity and walking are encouraged. Do not drive or drink alcohol if taking narcotic pain medications or having pain that might distract from driving.  Wound care: If you can keep drain site water proofed, 2 days after surgery (08/10), you may shower/get incision wet with soapy water and pat dry (do not rub incisions), but no baths or submerging incision underwater until follow-up.   Drain: Monitor and record drain output daily. You were given handouts for this. Please be sure to bring these to your follow up appointment. We typically follow up in about 1 week for drain removal.   Medications: Resume all home medications. For mild to moderate pain: acetaminophen (Tylenol) or ibuprofen/naproxen (if no kidney disease). Combining Tylenol with alcohol can substantially increase your risk of causing liver disease. Narcotic pain medications, if prescribed, can be used for severe pain, though may cause nausea, constipation, and drowsiness. Do not combine Tylenol and Percocet (or similar) within a 6 hour period as Percocet (and similar) contain(s) Tylenol. If you do not need the narcotic pain medication, you do not need to fill the prescription.  Call office [phone] / [phone]) at any time if any questions, worsening pain, fevers/chills, bleeding, drainage from incision site, or other concerns.

## 2022-08-03 NOTE — Plan of Care (Signed)
  Problem: Education: Goal: Knowledge of General Education information will improve Description: Including pain rating scale, medication(s)/side effects and non-pharmacologic comfort measures 08/03/2022 1515 by Avigdor Dollar Bet, LPN Outcome: Adequate for Discharge 08/03/2022 1130 by Marah Park Bet, LPN Outcome: Progressing   Problem: Health Behavior/Discharge Planning: Goal: Ability to manage health-related needs will improve 08/03/2022 1515 by Simya Tercero Bet, LPN Outcome: Adequate for Discharge 08/03/2022 1130 by Ivet Guerrieri Bet, LPN Outcome: Progressing   Problem: Clinical Measurements: Goal: Ability to maintain clinical measurements within normal limits will improve 08/03/2022 1515 by Tuan Tippin Bet, LPN Outcome: Adequate for Discharge 08/03/2022 1130 by Audreana Hancox Bet, LPN Outcome: Progressing Goal: Will remain free from infection 08/03/2022 1515 by Court Gracia Bet, LPN Outcome: Adequate for Discharge 08/03/2022 1130 by Jovoni Borkenhagen Bet, LPN Outcome: Progressing Goal: Diagnostic test results will improve 08/03/2022 1515 by Daliana Leverett Bet, LPN Outcome: Adequate for Discharge 08/03/2022 1130 by Brandalyn Harting Bet, LPN Outcome: Progressing Goal: Respiratory complications will improve 08/03/2022 1515 by Saloma Cadena Bet, LPN Outcome: Adequate for Discharge 08/03/2022 1130 by Aubryn Spinola Bet, LPN Outcome: Progressing Goal: Cardiovascular complication will be avoided 08/03/2022 1515 by Fernand Sorbello Bet, LPN Outcome: Adequate for Discharge 08/03/2022 1130 by Callen Zuba Bet, LPN Outcome: Progressing   Problem: Activity: Goal: Risk for activity intolerance will decrease 08/03/2022 1515 by Yareth Kearse Bet, LPN Outcome: Adequate for Discharge 08/03/2022 1130 by Early Ord Bet, LPN Outcome: Progressing   Problem: Nutrition: Goal: Adequate nutrition will be maintained 08/03/2022 1515 by Cove Haydon Bet, LPN Outcome: Adequate for Discharge 08/03/2022 1130 by Jeanett Antonopoulos Bet, LPN Outcome: Progressing   Problem:  Coping: Goal: Level of anxiety will decrease 08/03/2022 1515 by Azoria Abbett Bet, LPN Outcome: Adequate for Discharge 08/03/2022 1130 by Samnang Shugars Bet, LPN Outcome: Progressing   Problem: Elimination: Goal: Will not experience complications related to bowel motility 08/03/2022 1515 by Yessenia Maillet Bet, LPN Outcome: Adequate for Discharge 08/03/2022 1130 by Cydne Grahn Bet, LPN Outcome: Progressing Goal: Will not experience complications related to urinary retention 08/03/2022 1515 by Annali Lybrand Bet, LPN Outcome: Adequate for Discharge 08/03/2022 1130 by Ivah Girardot Bet, LPN Outcome: Progressing   Problem: Pain Managment: Goal: General experience of comfort will improve 08/03/2022 1515 by Jubilee Vivero Bet, LPN Outcome: Adequate for Discharge 08/03/2022 1130 by Ardeth Repetto Bet, LPN Outcome: Progressing   Problem: Safety: Goal: Ability to remain free from injury will improve 08/03/2022 1515 by Purva Vessell Bet, LPN Outcome: Adequate for Discharge 08/03/2022 1130 by Chealsey Miyamoto Bet, LPN Outcome: Progressing   Problem: Skin Integrity: Goal: Risk for impaired skin integrity will decrease 08/03/2022 1515 by Akin Yi Bet, LPN Outcome: Adequate for Discharge 08/03/2022 1130 by Jakolby Sedivy Bet, LPN Outcome: Progressing

## 2022-08-03 NOTE — TOC Initial Note (Signed)
Transition of Care [facility]) - Initial/Assessment Note    Patient Details  Name: Brenda Miles MRN: [id number] Date of Birth: [birth year]  [facility] CM/SW Contact:    Laurena Slimmer, RN Phone Number: 08/03/2022, 9:06 AM  Clinical Narrative:                  Transition of Care [facility]) Screening Note   Patient Details  Name: Brenda Miles Date of Birth: Mar 31, 1971   Transition of [facility]) CM/SW Contact:    Laurena Slimmer, RN Phone Number: 08/03/2022, 9:06 AM    Transition of Care Department [facility]) has reviewed patient and no TOC needs have been identified at this time. We will continue to monitor patient advancement through interdisciplinary progression rounds. If new patient transition needs arise, please place a TOC consult.          Patient Goals and CMS Choice        Expected Discharge Plan and Services           Expected Discharge Date: 08/03/22                                    Prior Living Arrangements/Services                       Activities of Daily Living Home Assistive Devices/Equipment: Eyeglasses ADL Screening (condition at time of admission) Patient's cognitive ability adequate to safely complete daily activities?: Yes Is the patient deaf or have difficulty hearing?: No Does the patient have difficulty seeing, even when wearing glasses/contacts?: No Does the patient have difficulty concentrating, remembering, or making decisions?: No Patient able to express need for assistance with ADLs?: Yes Does the patient have difficulty dressing or bathing?: No Independently performs ADLs?: Yes (appropriate for developmental age) Does the patient have difficulty walking or climbing stairs?: No Weakness of Legs: None Weakness of Arms/Hands: None  Permission Sought/Granted                  Emotional Assessment              Admission diagnosis:  Acute cholecystitis [K81.0] Patient Active Problem List   Diagnosis  Date Noted   Acute cholecystitis 08/02/2022   PCP:  Dion Body, MD Pharmacy:   [id number] [place], [address] [address]Phone: [phone]: [phone]    Social Determinants of Health (SDOH) Interventions    Readmission Risk Interventions     No data to display

## 2022-08-03 NOTE — Plan of Care (Signed)

## 2022-08-04 LAB — SURGICAL PATHOLOGY

## 2022-08-05 ENCOUNTER — Telehealth: Payer: Self-pay | Admitting: *Deleted

## 2022-08-05 NOTE — Telephone Encounter (Signed)
Patient called and wants some clarification on dressing changes at her drain. She stated that he hospital told her to only change the dressing if it gets wet and our office told her to change it daily.   Patient had surgery on 08/02/22 Dr Hampton Abbot laparoscopic choleystectomy

## 2022-08-08 ENCOUNTER — Ambulatory Visit (INDEPENDENT_AMBULATORY_CARE_PROVIDER_SITE_OTHER): Payer: BC Managed Care – PPO | Admitting: Surgery

## 2022-08-08 ENCOUNTER — Encounter: Payer: Self-pay | Admitting: Surgery

## 2022-08-08 VITALS — BP 127/80 | HR 74 | Temp 98.4°F | Ht 66.0 in | Wt 171.0 lb

## 2022-08-08 DIAGNOSIS — Z09 Encounter for follow-up examination after completed treatment for conditions other than malignant neoplasm: Secondary | ICD-10-CM

## 2022-08-08 DIAGNOSIS — K810 Acute cholecystitis: Secondary | ICD-10-CM

## 2022-08-08 DIAGNOSIS — K8000 Calculus of gallbladder with acute cholecystitis without obstruction: Secondary | ICD-10-CM

## 2022-08-08 NOTE — Progress Notes (Signed)
08/08/2022  HPI: Shaneca Orne is a 51 y.o. female s/p robotic assisted cholecystectomy on 08/02/22.  This was for elective symptomatic cholelithiasis but was found to have acute on chronic cholecystitis, with a very inflamed gallbladder.  A drain was left in place and she was discharged home the next day.  Today, she reports she's doing well, though still with some pain in the RUQ, particularly when taking a deep breath.  The drain has had serosanguinous output and is about 30 ml per day.  Vital signs: BP 127/80   Pulse 74   Temp 98.4 F (36.9 C)   Ht '5\' 6"'$  (1.676 m)   Wt 171 lb (77.6 kg)   LMP 07/22/2022 (Exact Date)   SpO2 98%   BMI 27.60 kg/m    Physical Exam: Constitutional:  No acute distress Abdomen:  soft, non-distended, appropriately tender to palpaiton.  Incisions are clean, dry, intact.  Drain with serosanguinous fluid.  Drain removed at bedside without complications.  Dry gauze dressing applied.  Assessment/Plan: This is a 51 y.o. female s/p robotic assisted cholecystectomy  --Drain removed today without issues.  Dry gauze dressing applied and instructions given on dressing changes.  --Discussed with the patient that her pain is likely combination from inflammatory changes with her cholecystitis as well as discomfort from the drain itself.  This should continue to improve. --Reminded her of activity restrictions, but she can otherwise return to work with lighter duties. --Follow up as needed.   Melvyn Neth, [facility]

## 2022-08-08 NOTE — Patient Instructions (Addendum)
You may take Tylenol or Ibuprofen for any pain/discomfort. Call us if the drain site develops any signs of infection.   Follow-up with our office as needed.  Please call and ask to speak with a nurse if you develop questions or concerns.   GENERAL POST-OPERATIVE PATIENT INSTRUCTIONS   WOUND CARE INSTRUCTIONS: Try to keep the wound dry and avoid ointments on the wound unless directed to do so.  If the wound becomes bright red and painful or starts to drain infected material that is not clear, please contact your physician immediately.  If the wound is mildly pink and has a thick firm ridge underneath it, this is normal, and is referred to as a healing ridge.  This will resolve over the next 4-6 weeks.  BATHING: You may shower if you have been informed of this by your surgeon. However, Please do not submerge in a tub, hot tub, or pool until incisions are completely sealed or have been told by your surgeon that you may do so.  DIET:  You may eat any foods that you can tolerate.  It is a good idea to eat a high fiber diet and take in plenty of fluids to prevent constipation.  If you do become constipated you may want to take a mild laxative or take ducolax tablets on a daily basis until your bowel habits are regular.  Constipation can be very uncomfortable, along with straining, after recent surgery.  ACTIVITY  You are encouraged to walk and engage in light activity for the next two weeks.  You should not lift more than 20 pounds for 6 weeks total after surgery as it could put you at increased risk for complications.  Twenty pounds is roughly equivalent to a plastic bag of groceries. At that time- Listen to your body when lifting, if you have pain when lifting, stop and then try again in a few days. Soreness after doing exercises or activities of daily living is normal as you get back in to your normal routine.  MEDICATIONS:  Try to take narcotic medications and anti-inflammatory medications, such as  tylenol, ibuprofen, naprosyn, etc., with food.  This will minimize stomach upset from the medication.  Should you develop nausea and vomiting from the pain medication, or develop a rash, please discontinue the medication and contact your physician.  You should not drive, make important decisions, or operate machinery when taking narcotic pain medication.  SUNBLOCK Use sun block to incision area over the next year if this area will be exposed to sun. This helps decrease scarring and will allow you avoid a permanent darkened area over your incision.  QUESTIONS:  Please feel free to call our office if you have any questions, and we will be glad to assist you. [phone]

## 2022-08-08 NOTE — Telephone Encounter (Signed)
Spoke with patient -she is scheduled to see Dr.Piscoya at 11 am.

## 2022-08-15 NOTE — Anesthesia Postprocedure Evaluation (Signed)
Anesthesia Post Note  Patient: Brenda Miles  Procedure(s) Performed: XI ROBOTIC ASSISTED LAPAROSCOPIC CHOLECYSTECTOMY (Abdomen) INDOCYANINE GREEN FLUORESCENCE IMAGING (ICG) (Abdomen)     Patient location during evaluation: PACU Anesthesia Type: General Level of consciousness: awake and alert Pain management: pain level controlled Vital Signs Assessment: post-procedure vital signs reviewed and stable Respiratory status: spontaneous breathing, nonlabored ventilation, respiratory function stable and patient connected to nasal cannula oxygen Cardiovascular status: blood pressure returned to baseline and stable Postop Assessment: no apparent nausea or vomiting Anesthetic complications: no   No notable events documented.  Molli Barrows

## 2022-08-21 ENCOUNTER — Ambulatory Visit
Admission: EM | Admit: 2022-08-21 | Discharge: 2022-08-21 | Disposition: A | Discharge status: Home / Self Care | Payer: BC Managed Care – PPO | Attending: Emergency Medicine | Admitting: Emergency Medicine

## 2022-08-21 ENCOUNTER — Encounter: Payer: Self-pay | Admitting: Emergency Medicine

## 2022-08-21 DIAGNOSIS — L03114 Cellulitis of left upper limb: Secondary | ICD-10-CM | POA: Diagnosis not present

## 2022-08-21 MED ORDER — DOXYCYCLINE HYCLATE 100 MG PO CAPS: 100.0000 mg | ORAL_CAPSULE | Freq: Two times a day (BID) | ORAL | 0 refills | Status: DC

## 2022-08-21 NOTE — ED Provider Notes (Signed)
[facility]    CSN: [id number] Arrival date & time: 08/21/22  1240      History   Chief Complaint Chief Complaint  Patient presents with   Arm Swelling    left    HPI Brenda Miles is a 51 y.o. female.   HPI  51 year old female here for evaluation of swelling and redness to her left hand and arm.  Patient reports that she noticed swelling and redness to the back of her left hand in the area where she had an IV placed 3 weeks ago for her gallbladder surgery.  She states that the area is very tender to touch and she is getting tingling in her fingers.  She has not had a fever.  She denies any drainage.  Since last night the redness has ascended up to the middle of her left upper arm.  Past Medical History:  Diagnosis Date   Anxiety    Arthritis    hands   Heartburn    Psoriasis    feet    Skin cancer, basal cell     Patient Active Problem List   Diagnosis Date Noted   Acute cholecystitis 08/02/2022    Past Surgical History:  Procedure Laterality Date   ANTERIOR CRUCIATE LIGAMENT REPAIR Left    APPENDECTOMY  2013   CHOLECYSTECTOMY      OB History   No obstetric history on file.      Home Medications    Prior to Admission medications   Medication Sig Start Date End Date Taking? Authorizing Provider  doxycycline (VIBRAMYCIN) 100 MG capsule Take 1 capsule (100 mg total) by mouth 2 (two) times daily. 08/21/22  Yes Margarette Canada, NP  acetaminophen (TYLENOL) 500 MG tablet Take 2 tablets (1,000 mg total) by mouth every 6 (six) hours as needed for mild pain. 07/27/22   Olean Ree, MD  b complex vitamins capsule Take 1 capsule by mouth daily.    [provider]  DULoxetine (CYMBALTA) 30 MG capsule Take 30 mg by mouth daily. 10/18/16   [provider]  ferrous sulfate 325 (65 FE) MG tablet Take 325 mg by mouth daily with breakfast.    [provider]  ibuprofen (ADVIL) 600 MG tablet Take 1 tablet (600 mg total) by mouth every 6  (six) hours as needed. 08/03/22   Tylene Fantasia, PA-C  omeprazole (PRILOSEC) 10 MG capsule Take 10 mg by mouth daily.    [provider]  ondansetron (ZOFRAN-ODT) 4 MG disintegrating tablet Take 1 tablet (4 mg total) by mouth every 8 (eight) hours as needed for nausea or vomiting. 07/23/22   Paulette Blanch, MD  oxyCODONE (ROXICODONE) 5 MG immediate release tablet Take 1 tablet (5 mg total) by mouth every 6 (six) hours as needed for severe pain or breakthrough pain. 08/03/22 08/03/23  Tylene Fantasia, PA-C  Probiotic Product (PROBIOTIC ADVANCED PO) Take by mouth.    [provider]  ranitidine (ZANTAC) 300 MG tablet Take 300 mg by mouth at bedtime.    [provider]  SAXENDA 18 MG/3ML SOPN Inject into the skin. Patient not taking: Reported on 08/08/2022 06/23/22   [provider]  VITAMIN D PO Take by mouth.    [provider]    Family History Family History  Problem Relation Age of Onset   Hypothyroidism Mother    Diabetes Father    Hypothyroidism Brother    Diabetes Daughter    Prostate cancer Neg Hx  Kidney cancer Neg Hx    Bladder Cancer Neg Hx     Social History Social History   Tobacco Use   Smoking status: Never    Passive exposure: Never   Smokeless tobacco: Never  Vaping Use   Vaping Use: Never used  Substance Use Topics   Alcohol use: Yes    Comment: occ   Drug use: No     Allergies   Patient has no known allergies.   Review of Systems Review of Systems  Constitutional:  Negative for fever.  Skin:  Positive for color change.  Neurological:  Negative for weakness and numbness.  Hematological: Negative.   Psychiatric/Behavioral: Negative.       Physical Exam Triage Vital Signs ED Triage Vitals  Enc Vitals Group     BP 08/21/22 1251 (!) 142/70     Pulse Rate 08/21/22 1251 83     Resp 08/21/22 1251 14     Temp 08/21/22 1251 98.6 F (37 C)     Temp Source 08/21/22 1251 Oral     SpO2 08/21/22 1251 98 %      Weight 08/21/22 1250 170 lb 15.8 oz (77.6 kg)     Height 08/21/22 1250 '5\' 6"'$  (1.676 m)     Head Circumference --      Peak Flow --      Pain Score --      Pain Loc --      Pain Edu? --      Excl. in [place]? --    No data found.  Updated Vital Signs BP (!) 142/70 (BP Location: Right Arm)   Pulse 83   Temp 98.6 F (37 C) (Oral)   Resp 14   Ht '5\' 6"'$  (1.676 m)   Wt 170 lb 15.8 oz (77.6 kg)   LMP 07/22/2022 (Exact Date)   SpO2 98%   BMI 27.60 kg/m   Visual Acuity Right Eye Distance:   Left Eye Distance:   Bilateral Distance:    Right Eye Near:   Left Eye Near:    Bilateral Near:     Physical Exam Vitals and nursing note reviewed.  Constitutional:      Appearance: Normal appearance. She is not ill-appearing.  HENT:     Head: Normocephalic and atraumatic.  Skin:    General: Skin is warm and dry.     Capillary Refill: Capillary refill takes less than 2 seconds.     Findings: Erythema present. No lesion.  Neurological:     General: No focal deficit present.     Mental Status: She is alert and oriented to person, place, and time.     Sensory: No sensory deficit.     Motor: No weakness.  Psychiatric:        Mood and Affect: Mood normal.        Behavior: Behavior normal.        Thought Content: Thought content normal.        Judgment: Judgment normal.      UC Treatments / Results  Labs (all labs ordered are listed, but only abnormal results are displayed) Labs Reviewed - No data to display  EKG   Radiology No results found.  Procedures Procedures (including critical care time)  Medications Ordered in UC Medications - No data to display  Initial Impression / Assessment and Plan / UC Course  I have reviewed the triage vital signs and the nursing notes.  Pertinent labs & imaging results that were available during  my care of the patient were reviewed by me and considered in my medical decision making (see chart for details).   Patient is a pleasant,  nontoxic-appearing 51 year old female here for evaluation of redness and swelling to the dorsum of her left hand with redness ascending her arm to the level of her left upper arm that she first noticed yesterday.  She states the area is tender to touch but it is not draining anything and she denies a fever.  The redness and swelling began in the area where she had an IV placed 2 to 3 weeks ago for her gallbladder surgery.        On exam patient has warm fingers and hand with a 2-second cap refill.  Radial and ulnar pulses are 2+.  There is not significant edema to the dorsum of her left hand but it is visible when comparing the contralateral side.  The skin is very sensitive to light touch in the area of redness is mildly warm when compared to the surrounding skin.  There is no induration or fluctuance.  There is no breaks in the skin noted.  Patient exam is consistent with cellulitis though it is unusual that it is presenting 3 weeks after she had an IV placed in her hand.  As noted above, there are no breaks in the skin and the IV site is well-healed.  I will place the patient on doxycycline for treatment of cellulitis and I have advised her that if her redness does not improve in the next 24 to 48 hours on antibiotics, or if the redness worsens, or if she develops a fever, that she needs to present to the ER for evaluation.  Patient verbalized understanding of same.   Final Clinical Impressions(s) / UC Diagnoses   Final diagnoses:  Cellulitis of left upper extremity     Discharge Instructions      Take the Doxycycline twice daily with food for 10 days.  Doxycycline will make you more sensitive to sunburn so wear sunscreen when outdoors and reapply it every 90 minutes.  Use OTC Tylenol and Ibuprofen according to the package instructions as needed for pain.  Return for new or worsening symptoms.  If your redness does not improve in 24-48 hours on antibiotics, or if it worsens, or you  develop a fever you need to go to the ER.     ED Prescriptions     Medication Sig Dispense Auth. Provider   doxycycline (VIBRAMYCIN) 100 MG capsule Take 1 capsule (100 mg total) by mouth 2 (two) times daily. 20 capsule Margarette Canada, NP      PDMP not reviewed this encounter.   Margarette Canada, NP 08/21/22 [phone]

## 2022-08-21 NOTE — Discharge Instructions (Signed)
Take the Doxycycline twice daily with food for 10 days.  Doxycycline will make you more sensitive to sunburn so wear sunscreen when outdoors and reapply it every 90 minutes.  Use OTC Tylenol and Ibuprofen according to the package instructions as needed for pain.  Return for new or worsening symptoms.  If your redness does not improve in 24-48 hours on antibiotics, or if it worsens, or you develop a fever you need to go to the ER.

## 2022-08-21 NOTE — ED Triage Notes (Signed)
Patient states that she had gallbladder surgery about 2-3 weeks ago.  Patient states that yesterday, she noticed swelling in her left hand that started where she had had an IV placed for her surgery.  Patient states that the swelling has gon up into her left forearm.

## 2023-07-30 ENCOUNTER — Ambulatory Visit
Admission: EM | Admit: 2023-07-30 | Discharge: 2023-07-30 | Disposition: A | Discharge status: Home / Self Care | Payer: BC Managed Care – PPO | Attending: Nurse Practitioner | Admitting: Nurse Practitioner

## 2023-07-30 ENCOUNTER — Encounter: Payer: Self-pay | Admitting: Emergency Medicine

## 2023-07-30 DIAGNOSIS — R399 Unspecified symptoms and signs involving the genitourinary system: Secondary | ICD-10-CM | POA: Insufficient documentation

## 2023-07-30 DIAGNOSIS — N898 Other specified noninflammatory disorders of vagina: Secondary | ICD-10-CM | POA: Insufficient documentation

## 2023-07-30 LAB — POCT URINALYSIS DIP (MANUAL ENTRY)
Bilirubin, UA: NEGATIVE
Blood, UA: NEGATIVE
Glucose, UA: NEGATIVE mg/dL
Ketones, POC UA: NEGATIVE mg/dL
Nitrite, UA: NEGATIVE
Protein Ur, POC: NEGATIVE mg/dL
Spec Grav, UA: 1.015 (ref 1.010–1.025)
Urobilinogen, UA: 0.2 E.U./dL
pH, UA: 6.5 (ref 5.0–8.0)

## 2023-07-30 MED ORDER — FLUCONAZOLE 150 MG PO TABS: ORAL_TABLET | ORAL | 0 refills | Status: AC

## 2023-07-30 MED ORDER — PHENAZOPYRIDINE HCL 100 MG PO TABS: 100.0000 mg | ORAL_TABLET | Freq: Three times a day (TID) | ORAL | 0 refills | Status: AC | PRN

## 2023-07-30 NOTE — Discharge Instructions (Signed)
The urinalysis does not indicate an obvious urinary tract infection.  As discussed, a urine culture has been ordered.  You will be contacted if the culture result is positive.  Also, a cytology swab is pending, you will also be contacted if the result is positive. Take medication as prescribed. Make sure you are drinking plenty of fluids.  Try to drink at least 8-10 8 ounce glass of water daily. Develop a toileting schedule that will allow you to urinate every 2 hours. Avoid caffeine such as tea, soda, and coffee while symptoms persist. If the urine culture is negative, and you are continuing to experience symptoms, please follow-up with your primary care physician for further evaluation. Follow-up as needed.

## 2023-07-30 NOTE — ED Provider Notes (Signed)
[facility]    CSN: [id number] Arrival date & time: 07/30/23  1240      History   Chief Complaint No chief complaint on file.   HPI Brenda Miles is a 52 y.o. female.   The history is provided by the patient.   Patient presents for UTI symptoms.  Symptoms include urinary urgency, frequency, and dysuria.  Patient states symptoms started 3 days ago.  Patient states that prior to her symptoms starting, she was on amoxicillin for tooth infection, which is approximately 2 weeks ago.  She states when she finished the amoxicillin, she developed urinary symptoms.  She states she was prescribed a 3-day course of Bactrim, completing the medications 2 to 3 days ago.  Patient states symptoms had improved while she was on the medication, but as soon as she stopped, symptoms returned.  She continues to have urgency, frequency, and dysuria.  She denies fever, chills, chest pain, abdominal pain, nausea, vomiting, diarrhea, flank pain, low back pain, or hematuria.  Patient reports that she has developed vaginal itching since being on the antibiotic.  Patient is not concerned for STI or STD as she is currently married.  Past Medical History:  Diagnosis Date   Anxiety    Arthritis    hands   Heartburn    Psoriasis    feet    Skin cancer, basal cell     Patient Active Problem List   Diagnosis Date Noted   Acute cholecystitis 08/02/2022    Past Surgical History:  Procedure Laterality Date   ANTERIOR CRUCIATE LIGAMENT REPAIR Left    APPENDECTOMY  2013   CHOLECYSTECTOMY      OB History   No obstetric history on file.      Home Medications    Prior to Admission medications   Medication Sig Start Date End Date Taking? Authorizing Provider  fluconazole (DIFLUCAN) 150 MG tablet Take 1 tablet by mouth today.  If symptoms persist, take the second tablet in 72 hours. 07/30/23  Yes Treva Huyett-Warren, Sadie Haber, NP  phenazopyridine (PYRIDIUM) 100 MG tablet Take 1 tablet (100 mg total) by  mouth 3 (three) times daily as needed for pain. 07/30/23  Yes Eveleen Mcnear-Warren, Sadie Haber, NP  acetaminophen (TYLENOL) 500 MG tablet Take 2 tablets (1,000 mg total) by mouth every 6 (six) hours as needed for mild pain. 07/27/22   Henrene Dodge, MD  b complex vitamins capsule Take 1 capsule by mouth daily.    [provider]  DULoxetine (CYMBALTA) 30 MG capsule Take 30 mg by mouth daily. 10/18/16   [provider]  ferrous sulfate 325 (65 FE) MG tablet Take 325 mg by mouth daily with breakfast.    [provider]  ibuprofen (ADVIL) 600 MG tablet Take 1 tablet (600 mg total) by mouth every 6 (six) hours as needed. 08/03/22   Donovan Kail, PA-C  ondansetron (ZOFRAN-ODT) 4 MG disintegrating tablet Take 1 tablet (4 mg total) by mouth every 8 (eight) hours as needed for nausea or vomiting. 07/23/22   Irean Hong, MD  Probiotic Product (PROBIOTIC ADVANCED PO) Take by mouth.    [provider]  SAXENDA 18 MG/3ML SOPN Inject into the skin. Patient not taking: Reported on 08/08/2022 06/23/22   [provider]  VITAMIN D PO Take by mouth.    [provider]    Family History Family History  Problem Relation Age of Onset   Hypothyroidism Mother    Diabetes Father    Hypothyroidism  Brother    Diabetes Daughter    Prostate cancer Neg Hx    Kidney cancer Neg Hx    Bladder Cancer Neg Hx     Social History Social History   Tobacco Use   Smoking status: Never    Passive exposure: Never   Smokeless tobacco: Never  Vaping Use   Vaping status: Never Used  Substance Use Topics   Alcohol use: Yes    Comment: occ   Drug use: No     Allergies   Patient has no known allergies.   Review of Systems Review of Systems Per HPI  Physical Exam Triage Vital Signs ED Triage Vitals [07/30/23 1351]  Encounter Vitals Group     BP (!) 148/80     Systolic BP Percentile      Diastolic BP Percentile      Pulse Rate 78     Resp 16     Temp 98.2 F (36.8  C)     Temp Source Oral     SpO2 98 %     Weight      Height      Head Circumference      Peak Flow      Pain Score      Pain Loc      Pain Education      Exclude from Growth Chart    No data found.  Updated Vital Signs BP (!) 148/80 (BP Location: Right Arm)   Pulse 78   Temp 98.2 F (36.8 C) (Oral)   Resp 16   LMP 07/10/2023 (Exact Date)   SpO2 98%   Visual Acuity Right Eye Distance:   Left Eye Distance:   Bilateral Distance:    Right Eye Near:   Left Eye Near:    Bilateral Near:     Physical Exam Vitals and nursing note reviewed.  Constitutional:      General: She is not in acute distress.    Appearance: Normal appearance.  HENT:     Head: Normocephalic.  Eyes:     Extraocular Movements: Extraocular movements intact.     Pupils: Pupils are equal, round, and reactive to light.  Cardiovascular:     Rate and Rhythm: Normal rate and regular rhythm.     Pulses: Normal pulses.     Heart sounds: Normal heart sounds.  Pulmonary:     Effort: Pulmonary effort is normal. No respiratory distress.     Breath sounds: Normal breath sounds. No stridor. No wheezing, rhonchi or rales.  Abdominal:     General: Bowel sounds are normal.     Palpations: Abdomen is soft.     Tenderness: There is no abdominal tenderness. There is no right CVA tenderness or left CVA tenderness.  Musculoskeletal:     Cervical back: Normal range of motion.  Skin:    General: Skin is warm and dry.  Neurological:     General: No focal deficit present.     Mental Status: She is alert and oriented to person, place, and time.  Psychiatric:        Mood and Affect: Mood normal.        Behavior: Behavior normal.      UC Treatments / Results  Labs (all labs ordered are listed, but only abnormal results are displayed) Labs Reviewed  POCT URINALYSIS DIP (MANUAL ENTRY) - Abnormal; Notable for the following components:      Result Value   Leukocytes, UA Trace (*)    All  other components within  normal limits  CERVICOVAGINAL ANCILLARY ONLY    EKG   Radiology No results found.  Procedures Procedures (including critical care time)  Medications Ordered in UC Medications - No data to display  Initial Impression / Assessment and Plan / UC Course  I have reviewed the triage vital signs and the nursing notes.  Pertinent labs & imaging results that were available during my care of the patient were reviewed by me and considered in my medical decision making (see chart for details).  The patient is well-appearing, she is in no acute distress, vital signs are stable.  Urinalysis does not indicate an obvious urinary tract infection.  Discussed same with patient.  Patient was also advised that a urine culture is recommended as she did not receive complete results or resolution of her UTI symptoms with Bactrim.  Urine culture and cytology swab are pending.  For patient's vaginal itching, fluconazole 150 mg was prescribed.  Supportive care recommendations were provided and discussed with the patient to include increasing fluids, allowing for plenty of rest, and continue to drink plenty of fluids.  Patient advised to follow-up if symptoms are worsening prior to receipt of the culture result.  Patient was also advised that if the culture result is negative and she is continue to experience symptoms, recommend following up with her primary care physician for further evaluation.  Patient is in agreement with this plan of care and verbalizes understanding.  All questions were answered.  Patient stable for discharge.   Final Clinical Impressions(s) / UC Diagnoses   Final diagnoses:  Vaginal itching  UTI symptoms     Discharge Instructions      The urinalysis does not indicate an obvious urinary tract infection.  As discussed, a urine culture has been ordered.  You will be contacted if the culture result is positive.  Also, a cytology swab is pending, you will also be contacted if the result  is positive. Take medication as prescribed. Make sure you are drinking plenty of fluids.  Try to drink at least 8-10 8 ounce glass of water daily. Develop a toileting schedule that will allow you to urinate every 2 hours. Avoid caffeine such as tea, soda, and coffee while symptoms persist. If the urine culture is negative, and you are continuing to experience symptoms, please follow-up with your primary care physician for further evaluation. Follow-up as needed.     ED Prescriptions     Medication Sig Dispense Auth. Provider   phenazopyridine (PYRIDIUM) 100 MG tablet Take 1 tablet (100 mg total) by mouth 3 (three) times daily as needed for pain. 10 tablet Raelyn Racette-Warren, Sadie Haber, NP   fluconazole (DIFLUCAN) 150 MG tablet Take 1 tablet by mouth today.  If symptoms persist, take the second tablet in 72 hours. 2 tablet Nahlia Hellmann-Warren, Sadie Haber, NP      PDMP not reviewed this encounter.   Abran Cantor, NP 07/30/23 1418

## 2023-07-30 NOTE — ED Triage Notes (Signed)
Urinary urgency, frequency and burning on urination since Tuesday.  Was placed on bactrim for 3 days .  Symptoms got better, after finishing medication, symptoms returned.   States if she is placed on an antibiotic she will need something for yeast infection.

## 2023-12-01 IMAGING — US US EXTREM LOW VENOUS*L*
1 series · 14 of 24 positions shown · non-contrast
Comparison: None Available.

CLINICAL DATA: Two months of left leg swelling and pain.

EXAM:
Left LOWER EXTREMITY VENOUS DOPPLER ULTRASOUND
TECHNIQUE: Gray-scale sonography with compression, as well as color and duplex
ultrasound, were performed to evaluate the deep venous system(s)
from the level of the common femoral vein through the popliteal and
proximal calf veins.

[Series 1: us venous img lower uni left (dvt) · portal-venous · 14 of 35 slices shown]
[im 1/35]
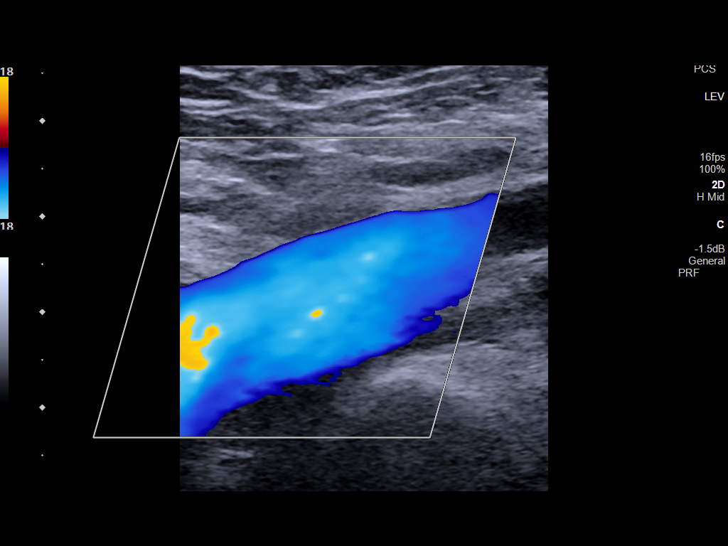
[im 3/35]
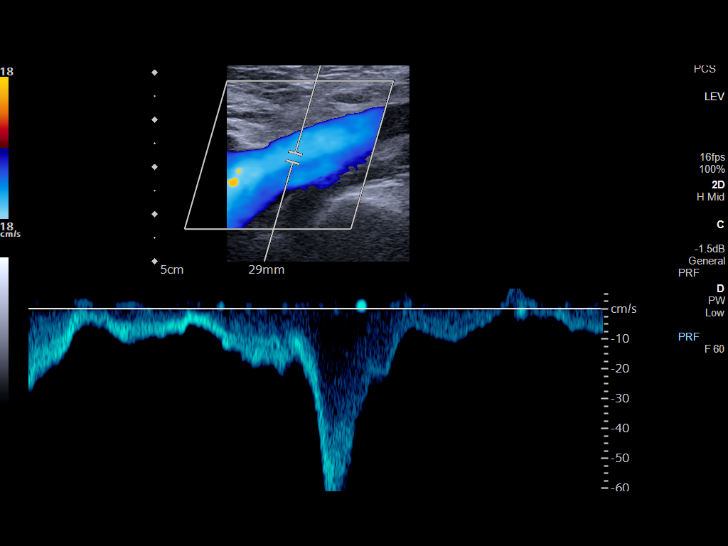
[im 6/35]
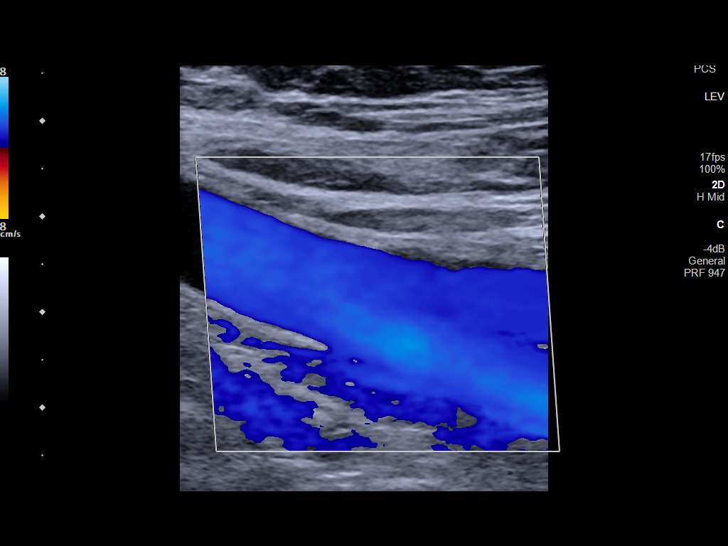
[im 9/35]
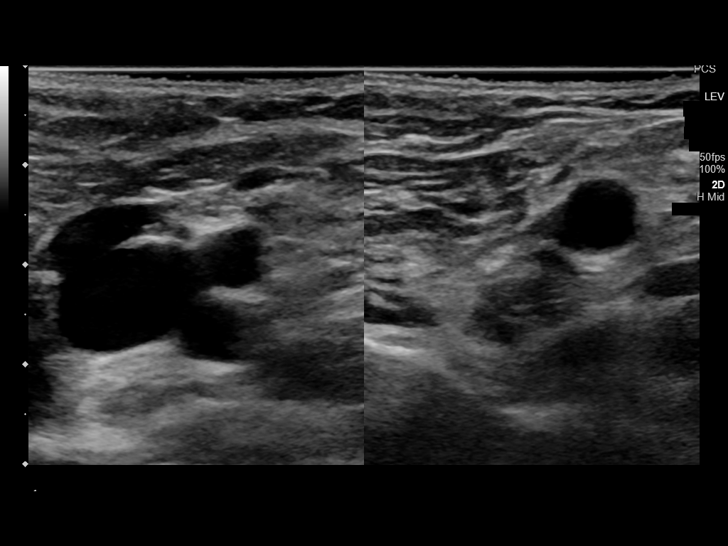
[im 11/35]
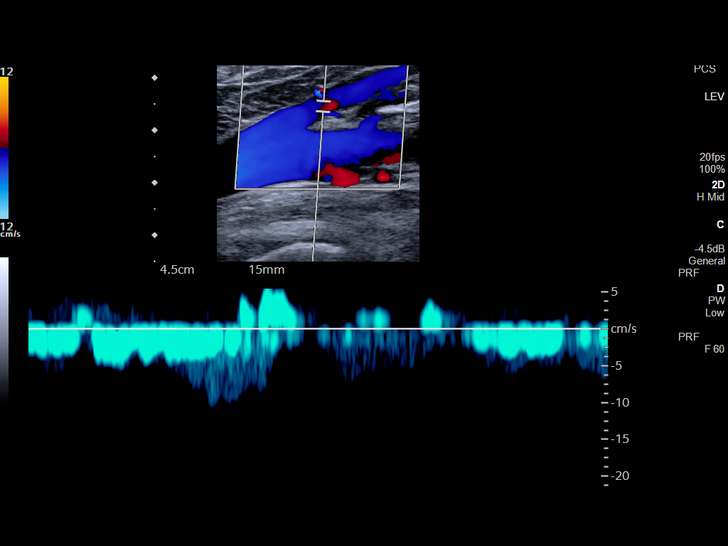
[im 14/35]
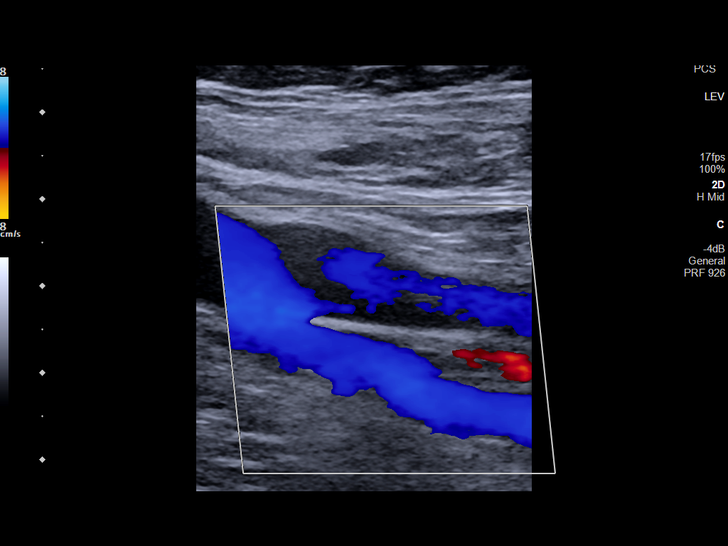
[im 17/35]
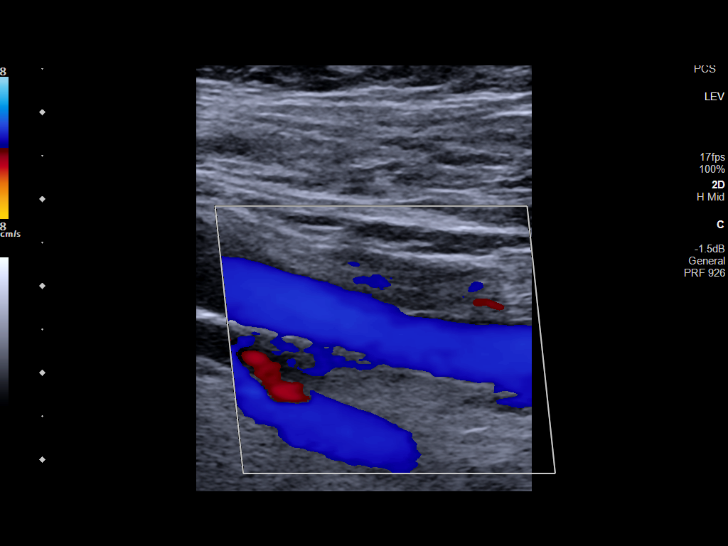
[im 18/35]
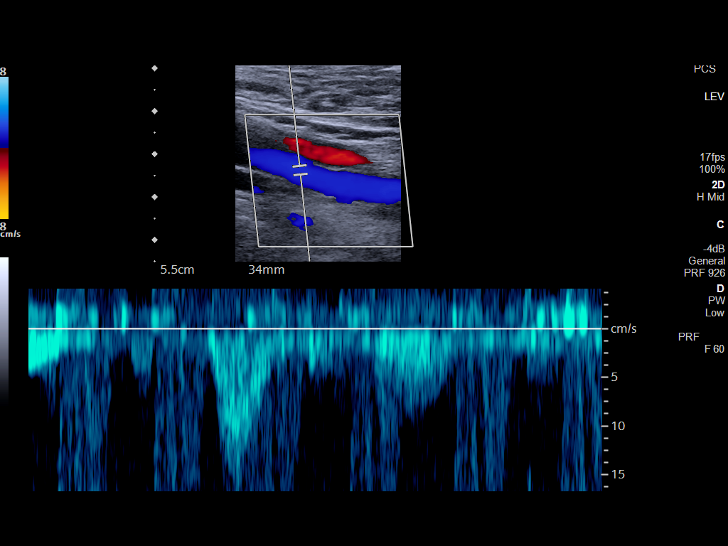
[im 21/35]
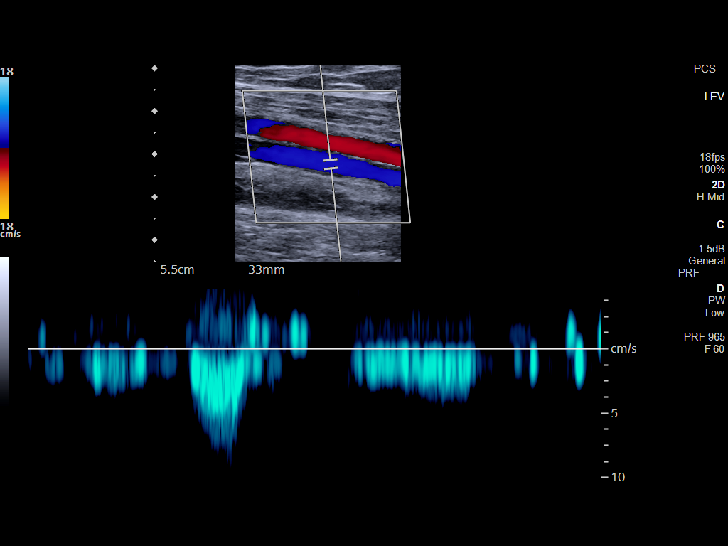
[im 24/35]
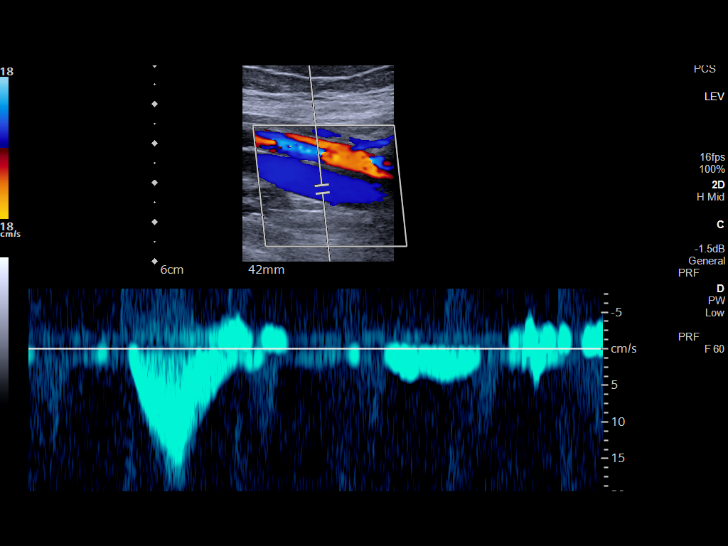
[im 27/35]
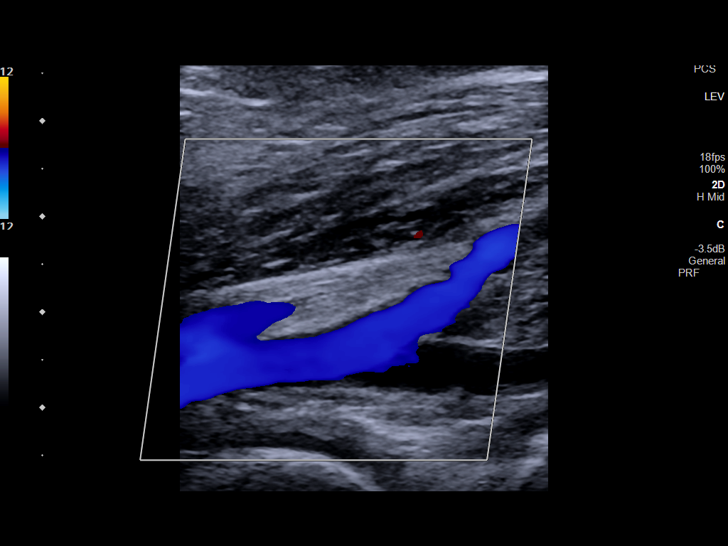
[im 29/35]
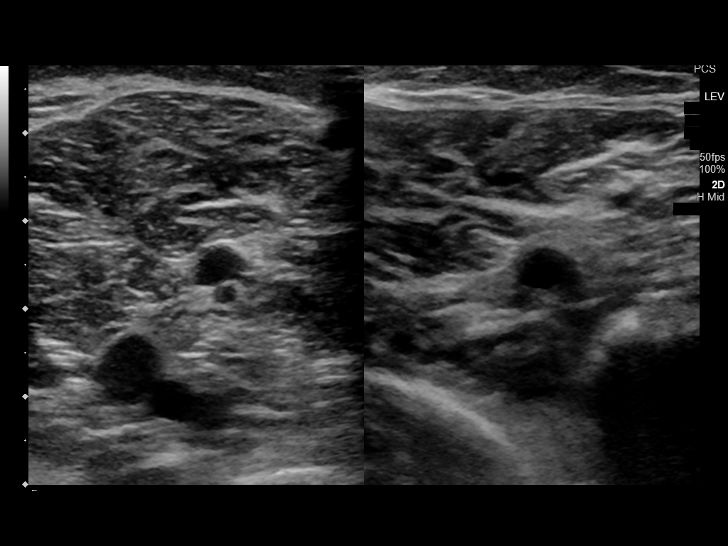
[im 32/35]
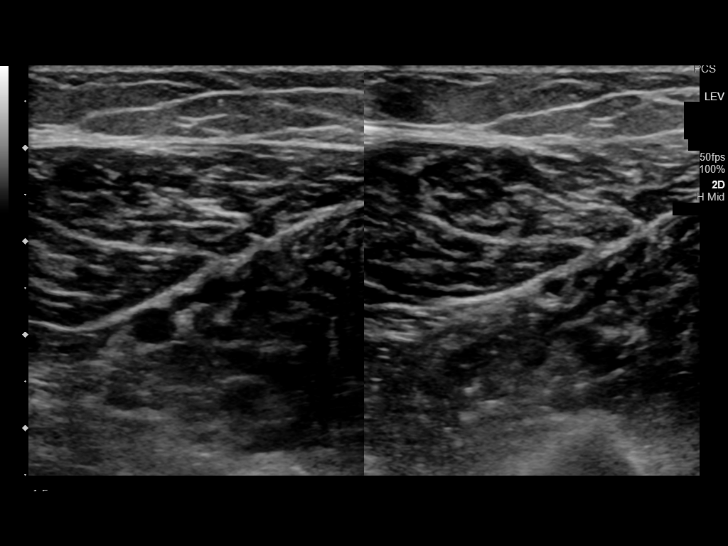
[im 35/35]
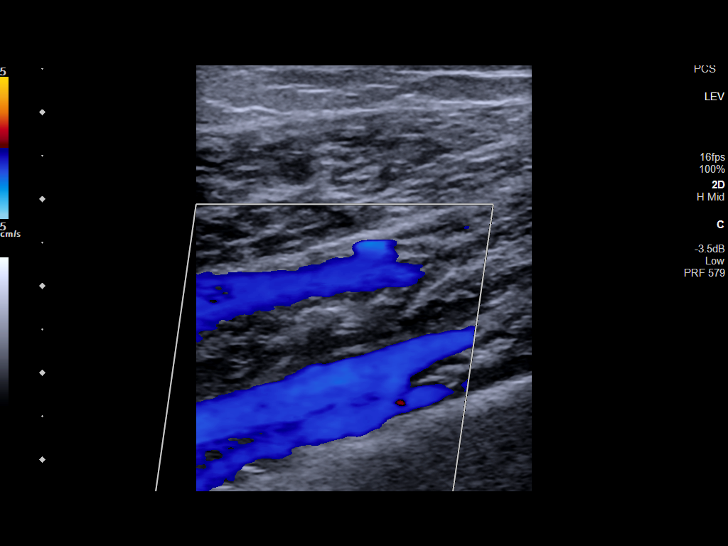

[14 of 24 positions shown; findings below may reference images not displayed]

FINDINGS: VENOUS

Normal compressibility of the common femoral, superficial femoral,
and popliteal veins, as well as the visualized calf veins.
Visualized portions of profunda femoral vein and great saphenous
vein unremarkable. No filling defects to suggest DVT on grayscale or
color Doppler imaging. Doppler waveforms show normal direction of
venous flow, normal respiratory plasticity and response to
augmentation.

Limited views of the contralateral common femoral vein are
unremarkable.

OTHER

None.

Limitations: none
IMPRESSION: No femoropopliteal DVT nor evidence of DVT within the visualized
calf veins.
